# Patient Record
Sex: Female | Born: 1965 | Race: White | Hispanic: No | Marital: Married | Smoking: Former smoker
Health system: Southern US, Community
[De-identification: ages and names within clinical notes are randomized; demographics above are authoritative.]

## PROBLEM LIST (undated history)

## (undated) DIAGNOSIS — Z87442 Personal history of urinary calculi: Secondary | ICD-10-CM

## (undated) DIAGNOSIS — Z8719 Personal history of other diseases of the digestive system: Secondary | ICD-10-CM

## (undated) DIAGNOSIS — K219 Gastro-esophageal reflux disease without esophagitis: Secondary | ICD-10-CM

## (undated) DIAGNOSIS — R0600 Dyspnea, unspecified: Secondary | ICD-10-CM

## (undated) DIAGNOSIS — R05 Cough: Secondary | ICD-10-CM

## (undated) DIAGNOSIS — F329 Major depressive disorder, single episode, unspecified: Secondary | ICD-10-CM

## (undated) DIAGNOSIS — M1990 Unspecified osteoarthritis, unspecified site: Secondary | ICD-10-CM

## (undated) DIAGNOSIS — F32A Depression, unspecified: Secondary | ICD-10-CM

## (undated) DIAGNOSIS — F419 Anxiety disorder, unspecified: Secondary | ICD-10-CM

## (undated) DIAGNOSIS — R059 Cough, unspecified: Secondary | ICD-10-CM

## (undated) DIAGNOSIS — E119 Type 2 diabetes mellitus without complications: Secondary | ICD-10-CM

## (undated) DIAGNOSIS — I10 Essential (primary) hypertension: Secondary | ICD-10-CM

## (undated) HISTORY — DX: Type 2 diabetes mellitus without complications: E11.9

## (undated) HISTORY — PX: TONSILLECTOMY: SUR1361

## (undated) HISTORY — PX: LITHOTRIPSY: SUR834

## (undated) HISTORY — PX: ABDOMINAL HYSTERECTOMY: SHX81

---
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
## 2002-03-10 HISTORY — PX: LAPAROSCOPIC ASSISTED VAGINAL HYSTERECTOMY: SHX5398

## 2007-08-19 ENCOUNTER — Ambulatory Visit: Payer: Self-pay | Admitting: Orthopedic Surgery

## 2007-09-02 ENCOUNTER — Ambulatory Visit: Payer: Self-pay | Admitting: Orthopedic Surgery

## 2009-10-02 ENCOUNTER — Ambulatory Visit: Payer: Self-pay | Admitting: General Practice

## 2013-12-28 ENCOUNTER — Emergency Department: Payer: Self-pay | Admitting: Emergency Medicine

## 2013-12-28 LAB — BASIC METABOLIC PANEL
Anion Gap: 9 (ref 7–16)
BUN: 16 mg/dL (ref 7–18)
CHLORIDE: 105 mmol/L (ref 98–107)
Calcium, Total: 9.1 mg/dL (ref 8.5–10.1)
Co2: 25 mmol/L (ref 21–32)
Creatinine: 0.77 mg/dL (ref 0.60–1.30)
EGFR (African American): >60
Glucose: 159 mg/dL — ABNORMAL HIGH (ref 65–99)
OSMOLALITY: 282 (ref 275–301)
POTASSIUM: 3.7 mmol/L (ref 3.5–5.1)
Sodium: 139 mmol/L (ref 136–145)

## 2013-12-28 LAB — CBC
HCT: 41.2 % (ref 35.0–47.0)
HGB: 13.2 g/dL (ref 12.0–16.0)
MCH: 27 pg (ref 26.0–34.0)
MCHC: 32.1 g/dL (ref 32.0–36.0)
MCV: 84 fL (ref 80–100)
PLATELETS: 270 10*3/uL (ref 150–440)
RBC: 4.89 10*6/uL (ref 3.80–5.20)
RDW: 14.2 % (ref 11.5–14.5)
WBC: 15.8 10*3/uL — ABNORMAL HIGH (ref 3.6–11.0)

## 2013-12-29 LAB — URINALYSIS, COMPLETE
BILIRUBIN, UR: NEGATIVE
Glucose,UR: NEGATIVE mg/dL (ref 0–75)
Ketone: NEGATIVE
LEUKOCYTE ESTERASE: NEGATIVE
Nitrite: NEGATIVE
PROTEIN: NEGATIVE
Ph: 5 (ref 4.5–8.0)
RBC,UR: 159 /HPF (ref 0–5)
SPECIFIC GRAVITY: 1.016 (ref 1.003–1.030)
Squamous Epithelial: 2

## 2014-01-03 ENCOUNTER — Ambulatory Visit: Payer: Self-pay | Admitting: Urology

## 2014-02-09 ENCOUNTER — Ambulatory Visit: Payer: Self-pay | Admitting: Urology

## 2014-03-05 ENCOUNTER — Emergency Department: Payer: Self-pay | Admitting: Emergency Medicine

## 2014-07-01 NOTE — Op Note (Signed)
PATIENT NAME:  Kristi Mccoy, Kristi Mccoy MR#:  [id number] DATE OF BIRTH:  1965-09-23  DATE OF PROCEDURE:  01/03/2014  PREOPERATIVE DIAGNOSIS: Right ureterolithiasis.   POSTOPERATIVE DIAGNOSIS: Passed urinary stone.   PROCEDURES:  1. Ureteroscopy.  2. Fluoroscopy.   SURGEON: Dr. Evelene CroonWolff.  ANESTHETIST: Dr. Darleene CleaverVan Staveren.  ANESTHETIC METHOD: General per Darleene CleaverVan Staveren and local per Dr. Evelene CroonWolff.   INDICATIONS: See the dictated history and physical. After informed consent, the patient requests the above procedure.   OPERATIVE SUMMARY: After adequate general anesthesia had been obtained, the patient was placed into dorsal lithotomy position and the perineum was prepped and draped in the usual fashion. Fluoroscopy was performed. The patient appeared to have a 4-mm calcification present in the right pelvic area. She also had several renal calculi bilaterally.   At this point, the mini Storz ureteroscope was coupled with a camera and then advanced into the bladder. The bladder was thoroughly inspected. No bladder tumors were identified. The ureteroscope was then passed into the right ureter distal ureter and advanced upward. The ureteroscope was advanced all the way to the renal pelvis and a stone or tumors were not identified. These ureteroscope was then slowly backed out of the ureter and again no stones were identified. At this point scope was removed from the bladder, 10 mL of viscous Xylocaine was instilled within the urethra and the bladder. B and O suppository was placed. The procedure was then terminated and the patient was transferred to the recovery room in stable condition.    ____________________________ Suszanne ConnersMichael R. Evelene CroonWolff, MD mrw:lt D: 01/03/2014 13:02:00 ET T: 01/03/2014 22:29:33 ET JOB#: [id number]  cc: Suszanne ConnersMichael R. Evelene CroonWolff, MD, <Dictator> Orson ApeMICHAEL R WOLFF MD ELECTRONICALLY SIGNED 01/04/2014 8:39

## 2014-07-01 NOTE — H&P (Signed)
PATIENT NAME:  Kristi Mccoy, [name] MR#:  [id number] DATE OF BIRTH:  16-Sep-1965  DATE OF ADMISSION:  01/03/2014  CHIEF COMPLAINT: Kidney stone.    HISTORY OF PRESENT ILLNESS:  Kristi Mccoy is a [age] year old Caucasian female who developed right flank pain and urinary urgency prompting Emergency Room visit on October 21. CAT scan at that time revealed a 4 mm distal right ureteral stone. She has failed to pass the stone and comes in now for right ureteroscopic ureterolithotomy with holmium laser lithotripsy.   ALLERGIES:  THE PATIENT WAS ALLERGIC TO ZITHROMAX.     CURRENT MEDICATIONS: Clonazepam, doxycycline, Percocet, Nucynta, and Zofran.   PREVIOUS SURGICAL PROCEDURES:  1.  Cesarean sections x 5.  2.  Hysterectomy in 2004.  3.  Lithotripsy in 1996.   SOCIAL HISTORY: The patient denied tobacco or alcohol use.   FAMILY HISTORY: Remarkable for father with diabetes and mother with hypothyroidism.      PAST AND CURRENT MEDICAL CONDITIONS:  1.  Recurrent kidney stone disease.  2.  Anxiety.  3.  Chronic abdominal pain related to her C-sections.     REVIEW OF SYSTEMS: The patient has constipation, fatigue, occasional diarrhea and a rash that is being treated with doxycycline. She denied chest pain, shortness of breath, diabetes, stroke, or hypertension.   PHYSICAL EXAMINATION:  GENERAL: Obese white female in no distress.  HEENT: Sclerae were clear. Pupils were equally round and reactive to light and accommodation. Extraocular movements are intact.  NECK: Supple. No palpable cervical adenopathy. No audible carotid bruits.  LUNGS: Clear to auscultation.  CARDIOVASCULAR: Regular rhythm and rate without audible murmurs.  ABDOMEN: Soft, nontender abdomen.  GENITOURINARY AND RECTAL: Deferred.  NEUROMUSCULAR: Alert and oriented x 3.   IMPRESSION:  1.  Right ureterolithiasis.  2.  Right nephrolithiasis.   PLAN: Right ureteroscopic ureterolithotomy with holmium laser lithotripsy.     ____________________________ Suszanne ConnersMichael R. Evelene CroonWolff, MD mrw:bu D: 12/29/2013 12:28:00 ET T: 12/29/2013 13:09:39 ET JOB#: [id number]  cc: Suszanne ConnersMichael R. Evelene CroonWolff, MD, <Dictator> Orson ApeMICHAEL R WOLFF MD ELECTRONICALLY SIGNED 01/03/2014 15:03

## 2016-01-08 ENCOUNTER — Encounter: Payer: Self-pay | Admitting: Emergency Medicine

## 2016-01-08 ENCOUNTER — Emergency Department
Admission: EM | Admit: 2016-01-08 | Discharge: 2016-01-09 | Disposition: A | Discharge status: Home / Self Care | Payer: BLUE CROSS/BLUE SHIELD | Attending: Student in an Organized Health Care Education/Training Program | Admitting: Student in an Organized Health Care Education/Training Program

## 2016-01-08 DIAGNOSIS — S0081XA Abrasion of other part of head, initial encounter: Secondary | ICD-10-CM | POA: Insufficient documentation

## 2016-01-08 DIAGNOSIS — S060X9A Concussion with loss of consciousness of unspecified duration, initial encounter: Secondary | ICD-10-CM

## 2016-01-08 DIAGNOSIS — W108XXA Fall (on) (from) other stairs and steps, initial encounter: Secondary | ICD-10-CM | POA: Diagnosis not present

## 2016-01-08 DIAGNOSIS — S0031XA Abrasion of nose, initial encounter: Secondary | ICD-10-CM | POA: Insufficient documentation

## 2016-01-08 DIAGNOSIS — Z23 Encounter for immunization: Secondary | ICD-10-CM | POA: Diagnosis not present

## 2016-01-08 DIAGNOSIS — S0993XA Unspecified injury of face, initial encounter: Secondary | ICD-10-CM

## 2016-01-08 DIAGNOSIS — Y999 Unspecified external cause status: Secondary | ICD-10-CM | POA: Diagnosis not present

## 2016-01-08 DIAGNOSIS — R55 Syncope and collapse: Secondary | ICD-10-CM

## 2016-01-08 DIAGNOSIS — Y9301 Activity, walking, marching and hiking: Secondary | ICD-10-CM | POA: Diagnosis not present

## 2016-01-08 DIAGNOSIS — Y9289 Other specified places as the place of occurrence of the external cause: Secondary | ICD-10-CM | POA: Diagnosis not present

## 2016-01-08 DIAGNOSIS — S0990XA Unspecified injury of head, initial encounter: Secondary | ICD-10-CM | POA: Diagnosis present

## 2016-01-08 NOTE — ED Triage Notes (Signed)
[facility] had sync[facility] episode, fell, and hit head.  Abrasion noted to left forehead and laceration to nose.  Bleeding controlled at this time.  Pt states she may be dehydrated.

## 2016-01-09 ENCOUNTER — Emergency Department: Payer: BLUE CROSS/BLUE SHIELD

## 2016-01-09 LAB — URINALYSIS COMPLETE WITH MICROSCOPIC (ARMC ONLY)
BILIRUBIN URINE: NEGATIVE
Hgb urine dipstick: NEGATIVE
Ketones, ur: NEGATIVE mg/dL
Leukocytes, UA: NEGATIVE
NITRITE: NEGATIVE
Protein, ur: 30 mg/dL — AB
Specific Gravity, Urine: 1.018 (ref 1.005–1.030)
pH: 5 (ref 5.0–8.0)

## 2016-01-09 LAB — CBC
HCT: 43.5 % (ref 35.0–47.0)
Hemoglobin: 14.3 g/dL (ref 12.0–16.0)
MCH: 27.8 pg (ref 26.0–34.0)
MCHC: 32.7 g/dL (ref 32.0–36.0)
MCV: 84.9 fL (ref 80.0–100.0)
PLATELETS: 254 10*3/uL (ref 150–440)
RBC: 5.13 MIL/uL (ref 3.80–5.20)
RDW: 14.1 % (ref 11.5–14.5)
WBC: 13.8 10*3/uL — ABNORMAL HIGH (ref 3.6–11.0)

## 2016-01-09 LAB — BASIC METABOLIC PANEL
Anion gap: 7 (ref 5–15)
BUN: 15 mg/dL (ref 6–20)
CALCIUM: 9.9 mg/dL (ref 8.9–10.3)
CO2: 28 mmol/L (ref 22–32)
CREATININE: 0.96 mg/dL (ref 0.44–1.00)
Chloride: 103 mmol/L (ref 101–111)
GLUCOSE: 237 mg/dL — AB (ref 65–99)
Potassium: 3.6 mmol/L (ref 3.5–5.1)
Sodium: 138 mmol/L (ref 135–145)

## 2016-01-09 LAB — TROPONIN I: Troponin I: <0.03 ng/mL (ref <0.03)

## 2016-01-09 MED ORDER — TETANUS-DIPHTH-ACELL PERTUSSIS 5-2.5-18.5 LF-MCG/0.5 IM SUSP
0.5000 mL | Freq: Once | INTRAMUSCULAR | Status: AC
  Administered 2016-01-09: 0.5 mL via INTRAMUSCULAR
  Filled 2016-01-09: qty 0.5

## 2016-01-09 MED ORDER — BUTALBITAL-APAP-CAFFEINE 50-325-40 MG PO TABS
1.0000 | ORAL_TABLET | Freq: Once | ORAL | Status: AC
  Administered 2016-01-09: 1 via ORAL

## 2016-01-09 MED ORDER — BUTALBITAL-APAP-CAFF-COD 50-325-40-30 MG PO CAPS
1.0000 | ORAL_CAPSULE | ORAL | 0 refills | Status: DC | PRN
Start: 2016-01-09 — End: 2020-05-03

## 2016-01-09 MED ORDER — PROCHLORPERAZINE EDISYLATE 5 MG/ML IJ SOLN
10.0000 mg | Freq: Once | INTRAMUSCULAR | Status: AC
  Administered 2016-01-09: 10 mg via INTRAVENOUS
  Filled 2016-01-09: qty 2

## 2016-01-09 MED ORDER — GI COCKTAIL ~~LOC~~
30.0000 mL | Freq: Once | ORAL | Status: AC
  Administered 2016-01-09: 30 mL via ORAL
  Filled 2016-01-09: qty 30

## 2016-01-09 MED ORDER — PROMETHAZINE HCL 25 MG PO TABS
25.0000 mg | ORAL_TABLET | Freq: Once | ORAL | Status: AC
  Administered 2016-01-09: 25 mg via ORAL

## 2016-01-09 MED ORDER — SODIUM CHLORIDE 0.9 % IV BOLUS (SEPSIS)
1000.0000 mL | Freq: Once | INTRAVENOUS | Status: AC
  Administered 2016-01-09: 1000 mL via INTRAVENOUS

## 2016-01-09 MED ORDER — ACETAMINOPHEN 500 MG PO TABS
1000.0000 mg | ORAL_TABLET | Freq: Once | ORAL | Status: AC
  Administered 2016-01-09: 1000 mg via ORAL
  Filled 2016-01-09: qty 2

## 2016-01-09 MED ORDER — PROMETHAZINE HCL 12.5 MG PO TABS: 12.5000 mg | ORAL_TABLET | Freq: Four times a day (QID) | ORAL | 0 refills | Status: DC | PRN

## 2016-01-09 MED ORDER — BUTALBITAL-APAP-CAFFEINE 50-325-40 MG PO TABS
ORAL_TABLET | ORAL | Status: AC
  Administered 2016-01-09: 1 via ORAL
  Filled 2016-01-09: qty 1

## 2016-01-09 MED ORDER — PROMETHAZINE HCL 25 MG PO TABS
ORAL_TABLET | ORAL | Status: AC
  Administered 2016-01-09: 25 mg via ORAL
  Filled 2016-01-09: qty 1

## 2016-01-09 NOTE — ED Notes (Signed)
Discharge instructions reviewed with patient. Patient verbalized understanding. Patient ambulated to lobby without difficulty.   

## 2016-01-09 NOTE — ED Provider Notes (Signed)
[facility] Emergency Department Provider Note    First MD Initiated Contact with Patient 01/09/16 0000     (approximate)  I have reviewed the triage vital signs and the nursing notes.   HISTORY  Chief Complaint Fall and Loss of Consciousness    HPI Kristi Mccoy is a 50 y.o. female with a history of fainting spells presents with a syncopal episode today. Patient states that she is feeling epigastric discomfort and nausea preceding the syncopal episode. She was walking on her patio. After the nausea started feeling lightheaded and that she was about to pass out. Went to sit down and in trying to lay down passed out and fell down 2 steps and landed on a concrete floor hitting the left side of her face. She woke up laying on the sidewalk. This occurred mid afternoon. She was accompanied by her daughter to the ER who encouraged her to come in. The patient was initially refusing to be evaluated. States that she has been dehydrated with decreased oral intake and decreased appetite over the past week due to increasing stresses. She denies any chest pain or shortness of breath. No previous heart history. She status post cholecystectomy as well as hysterectomy. States that she was imminently have epigastric pain and feels that she has an ulcer. States the epigastric pain occurs associated with a sense of hunger and is improved after eating.   History reviewed. No pertinent past medical history.  There are no active problems to display for this patient.   History reviewed. No pertinent surgical history.  Prior to Admission medications   Medication Sig Start Date End Date Taking? Authorizing Provider  butalbital-acetaminophen-caffeine (FIORICET WITH CODEINE) 50-325-40-30 MG capsule Take 1 capsule by mouth every 4 (four) hours as needed for headache. 01/09/16   Willy EddyPatrick Japji Kok, MD  promethazine (PHENERGAN) 12.5 MG tablet Take 1 tablet (12.5 mg total) by mouth every 6 (six)  hours as needed for nausea or vomiting. 01/09/16   Willy EddyPatrick Jaeleah Smyser, MD    Allergies Azithromycin  History reviewed. No pertinent family history.  Social History Social History  Substance Use Topics  . Smoking status: Never Smoker  . Smokeless tobacco: Never Used  . Alcohol use No    Review of Systems Patient denies headaches, rhinorrhea, blurry vision, numbness, shortness of breath, chest pain, edema, cough, abdominal pain, nausea, vomiting, diarrhea, dysuria, fevers, rashes or hallucinations unless otherwise stated above in HPI. ____________________________________________   PHYSICAL EXAM:  VITAL SIGNS: Vitals:   01/09/16 0500 01/09/16 0533  BP: 122/88 140/85  Pulse: 72 82  Resp: (!) 26 16  Temp:      Constitutional: Alert and oriented.  in no acute distress. Eyes: Conjunctivae are normal. PERRL. EOMI. Head: superficial abrasions to left forehead and bridge of nose.  Midface stable. Nose: No congestion/rhinnorhea. No septal hematoma Mouth/Throat: Mucous membranes are moist.  Oropharynx non-erythematous. Neck: No stridor. Painless ROM. No cervical spine tenderness to palpation Hematological/Lymphatic/Immunilogical: No cervical lymphadenopathy. Cardiovascular: Normal rate, regular rhythm. Grossly normal heart sounds.  Good peripheral circulation. Respiratory: Normal respiratory effort.  No retractions. Lungs CTAB. Gastrointestinal: Soft and nontender. No distention. No abdominal bruits. No CVA tenderness. Musculoskeletal: No lower extremity tenderness nor edema.  No joint effusions. Neurologic:  CN- intact.  No facial droop, Normal FNF.  Normal heel to shin.  Sensation intact bilaterally. Normal speech and language. No gross focal neurologic deficits are appreciated. No gait instability. Skin:  Skin is warm, dry and intact. Abrasion as above Psychiatric:  Mood and affect are normal. Speech and behavior are normal.  ____________________________________________    LABS (all labs ordered are listed, but only abnormal results are displayed)  Results for orders placed or performed during the hospital encounter of 01/08/16 (from the past 24 hour(s))  Basic metabolic panel     Status: Abnormal   Collection Time: 01/09/16 12:10 AM  Result Value Ref Range   Sodium 138 135 - 145 mmol/L   Potassium 3.6 3.5 - 5.1 mmol/L   Chloride 103 101 - 111 mmol/L   CO2 28 22 - 32 mmol/L   Glucose, Bld 237 (H) 65 - 99 mg/dL   BUN 15 6 - 20 mg/dL   Creatinine, Ser 1.61 0.44 - 1.00 mg/dL   Calcium 9.9 8.9 - 09.6 mg/dL   GFR calc non Af Amer >60 >60 mL/min   GFR calc Af Amer >60 >60 mL/min   Anion gap 7 5 - 15  CBC     Status: Abnormal   Collection Time: 01/09/16 12:10 AM  Result Value Ref Range   WBC 13.8 (H) 3.6 - 11.0 K/uL   RBC 5.13 3.80 - 5.20 MIL/uL   Hemoglobin 14.3 12.0 - 16.0 g/dL   HCT 04.5 40.9 - 81.1 %   MCV 84.9 80.0 - 100.0 fL   MCH 27.8 26.0 - 34.0 pg   MCHC 32.7 32.0 - 36.0 g/dL   RDW 91.4 78.2 - 95.6 %   Platelets 254 150 - 440 K/uL  Troponin I     Status: None   Collection Time: 01/09/16 12:10 AM  Result Value Ref Range   Troponin I <0.03 <0.03 ng/mL  Troponin I     Status: None   Collection Time: 01/09/16  2:51 AM  Result Value Ref Range   Troponin I <0.03 <0.03 ng/mL  Urinalysis complete, with microscopic     Status: Abnormal   Collection Time: 01/09/16  4:26 AM  Result Value Ref Range   Color, Urine YELLOW (A) YELLOW   APPearance HAZY (A) CLEAR   Glucose, UA >500 (A) NEGATIVE mg/dL   Bilirubin Urine NEGATIVE NEGATIVE   Ketones, ur NEGATIVE NEGATIVE mg/dL   Specific Gravity, Urine 1.018 1.005 - 1.030   Hgb urine dipstick NEGATIVE NEGATIVE   pH 5.0 5.0 - 8.0   Protein, ur 30 (A) NEGATIVE mg/dL   Nitrite NEGATIVE NEGATIVE   Leukocytes, UA NEGATIVE NEGATIVE   RBC / HPF 0-5 0 - 5 RBC/hpf   WBC, UA 0-5 0 - 5 WBC/hpf   Bacteria, UA RARE (A) NONE SEEN   Squamous Epithelial / LPF 0-5 (A) NONE SEEN   Mucous PRESENT    Hyaline Casts,  UA PRESENT    ____________________________________________  EKG My review and personal interpretation at Time: 0:04   Indication: syncope  Rate: 65  Rhythm: sinus Axis: normal Other: no acute ischemia, normal intervals, no wpw, brugada ____________________________________________  RADIOLOGY  I personally reviewed all radiographic images ordered to evaluate for the above acute complaints and reviewed radiology reports and findings.  These findings were personally discussed with the patient.  Please see medical record for radiology report.  ____________________________________________   PROCEDURES  Procedure(s) performed: none    Critical Care performed: no ____________________________________________   INITIAL IMPRESSION / ASSESSMENT AND PLAN / ED COURSE  Pertinent labs & imaging results that were available during my care of the patient were reviewed by me and considered in my medical decision making (see chart for details).  DDX: sah, iph, sdh, vasovagal,  dysrhythmia, acs, dehydration, uti  Kristi Mccoy is a 50 y.o. who presents to the ED with a syncopal episode and facial and head trauma as described above. Patient afebrile and hemodynamic stable. No evidence of thoracic or abdominal trauma on exam. No extremity trauma. CT imaging of the head ordered due to concern for acute traumatic injury. There is no evidence of proptosis or surrounding periorbital swelling. No clinical evidence of extraocular muscle entrapment. No trauma to the globe. CT imaging shows no evidence of acute intracranial abnormality.  Chest x-ray without any significant abnormality and no evidence of cardiomegaly or pneumothorax. Laboratory evaluation is unremarkable. EKG shows no evidence of dysrhythmia. Patient with multiple episodes of what she claims were vasovagal syncopal episodes in the past. Also has similar episodes. We'll provide wound care as well as observe patient on telemetry with repeat troponin to  further risk stratify for ACS. Patient denies any chest pain or shortness of breath.  Not clinically consistent with ACS.  The patient will be placed on continuous pulse oximetry and telemetry for monitoring.  Laboratory evaluation will be sent to evaluate for the above complaints.     Clinical Course  Comment By Time  Patient was able to tolerate PO and was able to ambulate with a steady gait.  Willy EddyPatrick Atiba Kimberlin, MD 11/01 0430  UA without evidence of infection.  Wound care was provided. Patient with improvement in symptoms. I discussed signs and symptoms of concussion and appropriate conservative therapy. Discussed signs and symptoms for which the patient should return immediately to the ER. Discussed follow-up with PCP.  Have discussed with the patient and available family all diagnostics and treatments performed thus far and all questions were answered to the best of my ability. The patient demonstrates understanding and agreement with plan.   Willy EddyPatrick Magdalyn Arenivas, MD 11/01 [phone]     ____________________________________________   FINAL CLINICAL IMPRESSION(S) / ED DIAGNOSES  Final diagnoses:  Syncope and collapse  Facial injury, initial encounter  Concussion with loss of consciousness, initial encounter      NEW MEDICATIONS STARTED DURING THIS VISIT:  Discharge Medication List as of 01/09/2016  5:46 AM    START taking these medications   Details  butalbital-acetaminophen-caffeine (FIORICET WITH CODEINE) 50-325-40-30 MG capsule Take 1 capsule by mouth every 4 (four) hours as needed for headache., Starting Wed 01/09/2016, Print    promethazine (PHENERGAN) 12.5 MG tablet Take 1 tablet (12.5 mg total) by mouth every 6 (six) hours as needed for nausea or vomiting., Starting Wed 01/09/2016, Print         Note:  This document was prepared using Dragon voice recognition software and may include unintentional dictation errors.    Willy EddyPatrick Quade Ramirez, MD 01/09/16 [phone]

## 2016-01-09 NOTE — ED Notes (Signed)
Pt ambulated in hall without difficulty. Pt denied dizziness, blurred vision, chest pain or SOB.

## 2016-01-09 NOTE — Discharge Instructions (Signed)
Keep abrasions out of sunlight until completely healed to prevent scarring.  Use cool compresses and compression for swelling.  Return for worsening symptoms, concerns, questions.

## 2016-01-09 NOTE — ED Notes (Signed)
ED Provider at bedside. 

## 2016-02-05 ENCOUNTER — Ambulatory Visit: Payer: BLUE CROSS/BLUE SHIELD | Admitting: *Deleted

## 2016-02-11 ENCOUNTER — Ambulatory Visit: Payer: BLUE CROSS/BLUE SHIELD | Admitting: *Deleted

## 2018-03-09 ENCOUNTER — Other Ambulatory Visit: Payer: Self-pay

## 2018-03-09 ENCOUNTER — Emergency Department
Admission: EM | Admit: 2018-03-09 | Discharge: 2018-03-09 | Disposition: A | Discharge status: Home / Self Care | Payer: BLUE CROSS/BLUE SHIELD | Attending: Emergency Medicine | Admitting: Emergency Medicine

## 2018-03-09 ENCOUNTER — Emergency Department: Payer: BLUE CROSS/BLUE SHIELD

## 2018-03-09 ENCOUNTER — Encounter: Payer: Self-pay | Admitting: Emergency Medicine

## 2018-03-09 DIAGNOSIS — Z7982 Long term (current) use of aspirin: Secondary | ICD-10-CM | POA: Diagnosis not present

## 2018-03-09 DIAGNOSIS — R0789 Other chest pain: Secondary | ICD-10-CM | POA: Insufficient documentation

## 2018-03-09 HISTORY — DX: Depression, unspecified: F32.A

## 2018-03-09 HISTORY — DX: Anxiety disorder, unspecified: F41.9

## 2018-03-09 HISTORY — DX: Major depressive disorder, single episode, unspecified: F32.9

## 2018-03-09 LAB — CBC
HCT: 45.4 % (ref 36.0–46.0)
HEMOGLOBIN: 14.8 g/dL (ref 12.0–15.0)
MCH: 27.7 pg (ref 26.0–34.0)
MCHC: 32.6 g/dL (ref 30.0–36.0)
MCV: 84.9 fL (ref 80.0–100.0)
Platelets: 275 10*3/uL (ref 150–400)
RBC: 5.35 MIL/uL — AB (ref 3.87–5.11)
RDW: 13 % (ref 11.5–15.5)
WBC: 7.4 10*3/uL (ref 4.0–10.5)
nRBC: 0 % (ref 0.0–0.2)

## 2018-03-09 LAB — BASIC METABOLIC PANEL
ANION GAP: 10 (ref 5–15)
BUN: 12 mg/dL (ref 6–20)
CALCIUM: 10.2 mg/dL (ref 8.9–10.3)
CO2: 22 mmol/L (ref 22–32)
Chloride: 105 mmol/L (ref 98–111)
Creatinine, Ser: 0.7 mg/dL (ref 0.44–1.00)
GFR calc Af Amer: >60 mL/min (ref >60)
GFR calc non Af Amer: >60 mL/min (ref >60)
GLUCOSE: 195 mg/dL — AB (ref 70–99)
POTASSIUM: 3.9 mmol/L (ref 3.5–5.1)
Sodium: 137 mmol/L (ref 135–145)

## 2018-03-09 LAB — TROPONIN I
Troponin I: <0.03 ng/mL (ref <0.03)
Troponin I: <0.03 ng/mL (ref <0.03)

## 2018-03-09 NOTE — ED Provider Notes (Signed)
[facility] Emergency Department Provider Note ____________________________________________   First MD Initiated Contact with Patient 03/09/18 1347     (approximate)  I have reviewed the triage vital signs and the nursing notes.   HISTORY  Chief Complaint Chest Pain    HPI Kristi Mccoy is a 52 y.o. female with PMH as noted below who presents with left-sided chest pain, gradual onset over the last few days, described as a tightness or pressure, and associated with nausea and decreased appetite.  She states that sometimes radiates to her left breast or left shoulder.  She states it is nonexertional and non-positional.  She has no significant shortness of breath or lightheadedness.  The patient reports significantly increased anxiety recently with some life stressors related to her family.  She states that she has had decreased appetite for this reason.  She also reports depression but this is chronic.  She denies SI or HI.   Past Medical History:  Diagnosis Date  . Anxiety   . Depression     There are no active problems to display for this patient.   Past Surgical History:  Procedure Laterality Date  . ABDOMINAL HYSTERECTOMY     partial  . CESAREAN SECTION     x5    Prior to Admission medications   Medication Sig Start Date End Date Taking? Authorizing Provider  butalbital-acetaminophen-caffeine (FIORICET WITH CODEINE) 50-325-40-30 MG capsule Take 1 capsule by mouth every 4 (four) hours as needed for headache. 01/09/16   Willy Eddyobinson, Patrick, MD  promethazine (PHENERGAN) 12.5 MG tablet Take 1 tablet (12.5 mg total) by mouth every 6 (six) hours as needed for nausea or vomiting. 01/09/16   Willy Eddyobinson, Patrick, MD    Allergies Azithromycin  No family history on file.  Social History Social History   Tobacco Use  . Smoking status: Never Smoker  . Smokeless tobacco: Never Used  Substance Use Topics  . Alcohol use: No  . Drug use: Never    Review of  Systems  Constitutional: No fever. Eyes: No redness. ENT: No neck pain. Cardiovascular: Positive for chest pain. Respiratory: Denies shortness of breath. Gastrointestinal: Positive for nausea. Genitourinary: Negative for flank pain.  Musculoskeletal: Negative for back pain. Skin: Negative for rash. Neurological: Negative for headache.   ____________________________________________   PHYSICAL EXAM:  VITAL SIGNS: ED Triage Vitals [03/09/18 1057]  Enc Vitals Group     BP (!) 158/95     Pulse Rate 77     Resp 16     Temp 98.3 F (36.8 C)     Temp Source Oral     SpO2 96 %     Weight 220 lb (99.8 kg)     Height 5\' 3"  (1.6 m)     Head Circumference      Peak Flow      Pain Score 6     Pain Loc      Pain Edu?      Excl. in GC?     Constitutional: Alert and oriented. Well appearing and in no acute distress. Eyes: Conjunctivae are normal.  Head: Atraumatic. Nose: No congestion/rhinnorhea. Mouth/Throat: Mucous membranes are moist.   Neck: Normal range of motion.  Cardiovascular: Normal rate, regular rhythm. Grossly normal heart sounds.  Good peripheral circulation. Respiratory: Normal respiratory effort.  No retractions. Lungs CTAB. Gastrointestinal: No distention.  Musculoskeletal: No lower extremity edema.  Extremities warm and well perfused.  Neurologic:  Normal speech and language. No gross focal neurologic deficits are appreciated.  Skin:  Skin is warm and dry. No rash noted.  No lymphadenopathy in the left axilla or chest wall or left lateral breast.  Breast is nontender. Psychiatric: Anxious appearing.  ____________________________________________   LABS (all labs ordered are listed, but only abnormal results are displayed)  Labs Reviewed  BASIC METABOLIC PANEL - Abnormal; Notable for the following components:      Result Value   Glucose, Bld 195 (*)    All other components within normal limits  CBC - Abnormal; Notable for the following components:   RBC  5.35 (*)    All other components within normal limits  TROPONIN I  TROPONIN I   ____________________________________________  EKG  ED ECG REPORT I, Dionne BucySebastian Dasha Kawabata, the attending physician, personally viewed and interpreted this ECG.  Date: 03/09/2018 EKG Time: 1050 Rate: 80 rhythm: normal sinus rhythm QRS Axis: normal Intervals: normal ST/T Wave abnormalities: normal Narrative Interpretation: no evidence of acute ischemia  ____________________________________________  RADIOLOGY  CXR: No focal infiltrate or other acute abnormality  ____________________________________________   PROCEDURES  Procedure(s) performed: No  Procedures  Critical Care performed: No ____________________________________________   INITIAL IMPRESSION / ASSESSMENT AND PLAN / ED COURSE  Pertinent labs & imaging results that were available during my care of the patient were reviewed by me and considered in my medical decision making (see chart for details).  52 year old female with PMH as noted above presents with atypical left-sided chest pain over the last several days associated with increased stress.  The patient reports nausea and decreased appetite.  The pain sometimes radiates to her left breast but she has no swelling, lymphadenopathy, or other acute symptoms related to the breast.  On exam she is anxious but relatively well-appearing.  Her vital signs are normal except for hypertension.  The remainder of her exam is unremarkable.  EKG is nonischemic.  Overall the presentation is not consistent with ACS.  There is no clinical evidence for DVT or PE, and no evidence for aortic dissection or other vascular etiology given the patient's vital signs and well appearance.  Overall I suspect most likely musculoskeletal pain, GERD especially related to her decreased p.o. intake, or primarily anxiety.  I had an extensive discussion with the patient about the likely causes of her pain and about  managing her life stressors.  Although the patient reports depression, this is chronic for her and she has no SI or HI.  I also encouraged her to eat small amounts more frequently and I will instruct her to start on an antacid for likely GERD.  Initial lab work-up, x-ray and troponin are all negative.  We will obtain a second troponin.  I anticipate discharge home if it is negative.  ----------------------------------------- 3:43 PM on 03/09/2018 -----------------------------------------  Repeat troponin is negative.  The patient continues to be comfortable.  She is still somewhat hypertensive although this is consistent with her anxiety.  I counseled her on the results of the work-up.  She is stable for discharge home at this time.  Return precautions given, and she expressed understanding. ____________________________________________   FINAL CLINICAL IMPRESSION(S) / ED DIAGNOSES  Final diagnoses:  Atypical chest pain      NEW MEDICATIONS STARTED DURING THIS VISIT:  Discharge Medication List as of 03/09/2018  3:14 PM       Note:  This document was prepared using Dragon voice recognition software and may include unintentional dictation errors.   Dionne BucySiadecki, Obinna Ehresman, MD 03/09/18 1544

## 2018-03-09 NOTE — Discharge Instructions (Addendum)
You should start taking an acid reducing medication such as Pepcid (famotidine) twice daily for the next few weeks to help decrease stomach acid.  Eat small meals or snacks throughout the day and make sure to drink small sips of water frequently to stay hydrated.  Follow-up with your regular doctor.  Return to the ER for new, worsening, persistent severe chest pain, difficulty breathing, vomiting, weakness or lightheadedness, or any other new or worsening symptoms that concern you.

## 2018-03-09 NOTE — ED Triage Notes (Signed)
Patient reports left-sided chest pain x3 day with radiation to jaw and left arm. Patient reports nausea and decreased appetite. Reports she typically has similar symptoms when she is stressed. Patient tearful in triage. Also reports SOB.

## 2018-03-09 NOTE — ED Notes (Signed)
Pt c/o chest pain radiating into the jaw for the past 3 days with nausea. Pt states she has been under a lot of stress recently and but wants to make sure nothing serious is wrong. Pt is ambulatory to the room with out any difficulty or SOB.

## 2018-09-01 ENCOUNTER — Other Ambulatory Visit: Payer: Self-pay

## 2018-09-01 ENCOUNTER — Encounter
Admission: RE | Admit: 2018-09-01 | Discharge: 2018-09-01 | Disposition: A | Discharge status: Home / Self Care | Payer: BC Managed Care – PPO | Source: Ambulatory Visit | Attending: Orthopedic Surgery | Admitting: Orthopedic Surgery

## 2018-09-01 DIAGNOSIS — Z01812 Encounter for preprocedural laboratory examination: Secondary | ICD-10-CM | POA: Insufficient documentation

## 2018-09-01 HISTORY — DX: Cough, unspecified: R05.9

## 2018-09-01 HISTORY — DX: Personal history of urinary calculi: Z87.442

## 2018-09-01 HISTORY — DX: Dyspnea, unspecified: R06.00

## 2018-09-01 HISTORY — DX: Unspecified osteoarthritis, unspecified site: M19.90

## 2018-09-01 LAB — CBC
HCT: 43.2 % (ref 36.0–46.0)
Hemoglobin: 14.5 g/dL (ref 12.0–15.0)
MCH: 28.2 pg (ref 26.0–34.0)
MCHC: 33.6 g/dL (ref 30.0–36.0)
MCV: 83.9 fL (ref 80.0–100.0)
Platelets: 271 10*3/uL (ref 150–400)
RBC: 5.15 MIL/uL — ABNORMAL HIGH (ref 3.87–5.11)
RDW: 13.1 % (ref 11.5–15.5)
WBC: 8.5 10*3/uL (ref 4.0–10.5)
nRBC: 0 % (ref 0.0–0.2)

## 2018-09-01 LAB — URINALYSIS, ROUTINE W REFLEX MICROSCOPIC
Bilirubin Urine: NEGATIVE
Glucose, UA: 50 mg/dL — AB
Hgb urine dipstick: NEGATIVE
Ketones, ur: NEGATIVE mg/dL
Leukocytes,Ua: NEGATIVE
Nitrite: NEGATIVE
Protein, ur: NEGATIVE mg/dL
Specific Gravity, Urine: 1.024 (ref 1.005–1.030)
pH: 5 (ref 5.0–8.0)

## 2018-09-01 LAB — SEDIMENTATION RATE: Sed Rate: 7 mm/hr (ref 0–30)

## 2018-09-01 LAB — COMPREHENSIVE METABOLIC PANEL
ALT: 31 U/L (ref 0–44)
AST: 26 U/L (ref 15–41)
Albumin: 4.4 g/dL (ref 3.5–5.0)
Alkaline Phosphatase: 110 U/L (ref 38–126)
Anion gap: 11 (ref 5–15)
BUN: 14 mg/dL (ref 6–20)
CO2: 23 mmol/L (ref 22–32)
Calcium: 9.7 mg/dL (ref 8.9–10.3)
Chloride: 101 mmol/L (ref 98–111)
Creatinine, Ser: 0.59 mg/dL (ref 0.44–1.00)
GFR calc Af Amer: >60 mL/min (ref >60)
GFR calc non Af Amer: >60 mL/min (ref >60)
Glucose, Bld: 228 mg/dL — ABNORMAL HIGH (ref 70–99)
Potassium: 3.8 mmol/L (ref 3.5–5.1)
Sodium: 135 mmol/L (ref 135–145)
Total Bilirubin: 0.8 mg/dL (ref 0.3–1.2)
Total Protein: 7.7 g/dL (ref 6.5–8.1)

## 2018-09-01 LAB — SURGICAL PCR SCREEN
MRSA, PCR: NEGATIVE
Staphylococcus aureus: NEGATIVE

## 2018-09-01 LAB — TYPE AND SCREEN
ABO/RH(D): O POS
Antibody Screen: NEGATIVE

## 2018-09-01 LAB — APTT: aPTT: 30 seconds (ref 24–36)

## 2018-09-01 LAB — PROTIME-INR
INR: 0.9 (ref 0.8–1.2)
Prothrombin Time: 12.4 seconds (ref 11.4–15.2)

## 2018-09-01 LAB — C-REACTIVE PROTEIN: CRP: 1 mg/dL — ABNORMAL HIGH (ref <1.0)

## 2018-09-01 NOTE — Pre-Procedure Instructions (Signed)
Carb drink and incentive spirometry given and instructed.

## 2018-09-01 NOTE — Patient Instructions (Signed)
Your procedure is scheduled on: 09/08/2018 Wed Report to Same Day Surgery 2nd floor medical mall [facility] Entrance-take elevator on left to 2nd floor.  Check in with surgery information desk.) To find out your arrival time please call [phone] between 1PM - 3PM on 09/07/2018 Tues  Remember: Instructions that are not followed completely may result in serious medical risk, up to and including death, or upon the discretion of your surgeon and anesthesiologist your surgery may need to be rescheduled.    _x___ 1. Do not eat food after midnight the night before your procedure. You may drink clear liquids up to 2 hours before you are scheduled to arrive at the hospital for your procedure.  Do not drink clear liquids within 2 hours of your scheduled arrival to the hospital.  Clear liquids include  --Water or Apple juice without pulp  --Clear carbohydrate beverage such as ClearFast or Gatorade  --Black Coffee or Clear Tea (No milk, no creamers, do not add anything to                  the coffee or Tea Type 1 and type 2 diabetics should only drink water.   ____Ensure clear carbohydrate drink on the way to the hospital for bariatric patients  ____Ensure clear carbohydrate drink 3 hours before surgery for Dr Dwyane Luo patients if physician instructed.   No gum chewing or hard candies.     __x__ 2. No Alcohol for 24 hours before or after surgery.   __x__3. No Smoking or e-cigarettes for 24 prior to surgery.  Do not use any chewable tobacco products for at least 6 hour prior to surgery   ____  4. Bring all medications with you on the day of surgery if instructed.    __x__ 5. Notify your doctor if there is any change in your medical condition     (cold, fever, infections).    x___6. On the morning of surgery brush your teeth with toothpaste and water.  You may rinse your mouth with mouth wash if you wish.  Do not swallow any toothpaste or mouthwash.   Do not wear jewelry, make-up, hairpins,  clips or nail polish.  Do not wear lotions, powders, or perfumes. You may wear deodorant.  Do not shave 48 hours prior to surgery. Men may shave face and neck.  Do not bring valuables to the hospital.    [facility] is not responsible for any belongings or valuables.               Contacts, dentures or bridgework may not be worn into surgery.  Leave your suitcase in the car. After surgery it may be brought to your room.  For patients admitted to the hospital, discharge time is determined by your                       treatment team.  _  Patients discharged the day of surgery will not be allowed to drive home.  You will need someone to drive you home and stay with you the night of your procedure.    Please read over the following fact sheets that you were given:   [facility] Preparing for Surgery and or MRSA Information   _x___ Take anti-hypertensive listed below, cardiac, seizure, asthma,     anti-reflux and psychiatric medicines. These include:  1. clonazePAM (KLONOPIN) 1 MG tablet  2.sertraline (ZOLOFT) 100 MG tablet  3.  4.  5.  6.  ____Fleets enema or Magnesium Citrate as directed.   _x___ Use CHG Soap or sage wipes as directed on instruction sheet   ____ Use inhalers on the day of surgery and bring to hospital day of surgery  ____ Stop Metformin and Janumet 2 days prior to surgery.    ____ Take 1/2 of usual insulin dose the night before surgery and none on the morning     surgery.   _x___ Follow recommendations from Cardiologist, Pulmonologist or PCP regarding          stopping Aspirin, Coumadin, Plavix ,Eliquis, Effient, or Pradaxa, and Pletal.  X____Stop Anti-inflammatories such as Advil, Aleve, Ibuprofen, Motrin, Naproxen, Naprosyn, Goodies powders or aspirin products. OK to take Tylenol and                          Celebrex.   _x___ Stop supplements until after surgery.  But may continue Vitamin D, Vitamin B,       and multivitamin.   ____ Bring C-Pap to the  hospital.

## 2018-09-02 LAB — URINE CULTURE: Special Requests: NORMAL

## 2018-09-03 ENCOUNTER — Other Ambulatory Visit
Admission: RE | Admit: 2018-09-03 | Discharge: 2018-09-03 | Disposition: A | Discharge status: Home / Self Care | Payer: BC Managed Care – PPO | Source: Ambulatory Visit | Attending: Orthopedic Surgery | Admitting: Orthopedic Surgery

## 2018-09-03 ENCOUNTER — Other Ambulatory Visit: Payer: Self-pay

## 2018-09-03 DIAGNOSIS — Z1159 Encounter for screening for other viral diseases: Secondary | ICD-10-CM | POA: Diagnosis not present

## 2018-09-03 DIAGNOSIS — Z01812 Encounter for preprocedural laboratory examination: Secondary | ICD-10-CM | POA: Diagnosis not present

## 2018-09-04 LAB — HEMOGLOBIN A1C
Hgb A1c MFr Bld: 9.1 % — ABNORMAL HIGH (ref 4.8–5.6)
Mean Plasma Glucose: 214 mg/dL

## 2018-09-04 LAB — NOVEL CORONAVIRUS, NAA (HOSP ORDER, SEND-OUT TO REF LAB; TAT 18-24 HRS): SARS-CoV-2, NAA: NOT DETECTED

## 2018-09-08 ENCOUNTER — Encounter: Admission: RE | Payer: Self-pay | Source: Ambulatory Visit

## 2018-09-08 ENCOUNTER — Inpatient Hospital Stay
Admission: RE | Admit: 2018-09-08 | Payer: BC Managed Care – PPO | Source: Ambulatory Visit | Admitting: Orthopedic Surgery

## 2018-09-08 SURGERY — ARTHROPLASTY, KNEE, TOTAL, USING IMAGELESS COMPUTER-ASSISTED NAVIGATION
Anesthesia: Choice | Laterality: Left

## 2020-04-27 ENCOUNTER — Other Ambulatory Visit: Payer: Self-pay | Admitting: Nurse Practitioner

## 2020-04-27 DIAGNOSIS — Z7189 Other specified counseling: Secondary | ICD-10-CM

## 2020-04-27 NOTE — Progress Notes (Signed)
Patient in need of referral to diabetic nutritionist- referral placed by provider converting to internal referral so patient can see nutritionist at [facility].

## 2020-05-01 ENCOUNTER — Other Ambulatory Visit: Payer: Self-pay

## 2020-05-01 ENCOUNTER — Ambulatory Visit: Payer: Self-pay | Admitting: Dietician

## 2020-05-01 NOTE — Progress Notes (Signed)
Nutrition consult: 05/01/2020  CC: " I need help to get my glucose under control so I can feel better and have knee surgery."  Assessment: Gives hx of having been dx with diabetes 3 years ago.Seems that she has not addressed the issues involved with diabetes self-management.  Needs "surgery to LF knee for the bone-on-bone pain." Surgery cancelled due to current A1C of 10.2% and fasting glucose of 215 mg/dl.  Seeing primary MD for diabetes care.  Currently on Metformin and being switched to Trijardy XR.  To start today.  She is very fearful of needles and an injectable therapy is not possible as far as she is concerned. She has an order to be seen at [facility] for Diabetes Self-Management.    Currently checking blood glucose levels.  At no point has she had a blood glucose level during the day less than 199 mm/dl. Fasting glucose levels are 200-220. Her knee pain limits mobility and activity.  She literally limits the amount of walking that she does.   At one point was eating one meal per day because she did not have to walk as much and stand up to prepare food.  Plus, "I'm just not hungry."  Gives a hx of GDM with 3 of her 5 pregnancies.  Did a carb restricted diet at that time and did well.  "I don't remember how the diet goes, but I want to try to get my sugar down."  Current Nutrient Intake: 8-9:00 AM 2 slices wheat toast + eggs and 1/2 slice cheese or small amount of PB. Mid-day: 2-3 thin slices of swiss cheese with a slice of roast beef. OR, if hungry; a salad of lettuce, grilled chicken, cheese and croutons. Evening: If did not have salad during the day, then will eat it in the evening or Pj Zehner have a chicken salad sandwich on wheat bread.  Constantly thirsty.  Drinks water, diet Pepsi, SF Natures Twist, or uses SF flavor packets for water. Drinks constantly through out the day and at night when awake.  Physical Activity: Very limited at this time due to knees.  Goal is to have LF knee surgery,  recover and then have the RT knee surgery.  Recommendations: 1. Limit carb to 15-30 gm per meal at breakfast, lunch and dinner.  If snacking have 15 gm of carb for snack.   2. Have a lean protein at all meals and snacks. 3. Increase use of non-starchy vegetables in diet.  Consider use of Steamers for cooked vegetable source. 4. Always use whole grain breads/cereals. 5. Use your food label, looking for 3 gm of fiber per serving where appropriate. 6. Monitor serving size of starchy vegetables, pasta and fruits.   7. Limit sugars to 0-9 gm/serving. 8. Use lean meats, serving size at 2-3 oz. 9. Use poultry and fish more often than your red meats. 10. Bake/broil/grill/roast r/t frying meats. 11. Use the monounsaturated fats and polyunsaturated r/t the saturated fats. 12. When resting in bed or chair during the day, take cans of soup or vegetables and use as weights.  Establish a routine of repetitions for use of the major muscles of the arms.  Start with as little as 2 reps per exercise routine.Do exercises each hour during the day. 13. See Inetta Fermo at [facility], and work with her at what would be the best support system for your diabetes self-management.  Currently you are in need of increased support. 14. I will be back at [facility] in March; if  you choose, you might follow-up with me.  Call [facility] for an appointment if you choose.  Teaching Materials: Handout for Pre-diabetes to use for the eating/dietary information. Diet Card with prescription. Food label with nutrition input. Foods and Food Groups handout.  Maggie Jarmaine Ehrler, RN, RD, LDN

## 2020-05-03 ENCOUNTER — Encounter: Payer: BC Managed Care – PPO | Attending: Family Medicine | Admitting: *Deleted

## 2020-05-03 ENCOUNTER — Other Ambulatory Visit: Payer: Self-pay

## 2020-05-03 ENCOUNTER — Encounter: Payer: Self-pay | Admitting: *Deleted

## 2020-05-03 VITALS — BP 136/90 | Ht 63.0 in | Wt 210.2 lb

## 2020-05-03 DIAGNOSIS — E1165 Type 2 diabetes mellitus with hyperglycemia: Secondary | ICD-10-CM

## 2020-05-03 DIAGNOSIS — E119 Type 2 diabetes mellitus without complications: Secondary | ICD-10-CM | POA: Insufficient documentation

## 2020-05-03 NOTE — Progress Notes (Signed)
Diabetes Self-Management Education  Visit Type: First/Initial  Appt. Start Time: 1320 Appt. End Time: 1445  05/03/2020  Kristi Mccoy, identified by name and date of birth, is a 55 y.o. female with a diagnosis of Diabetes: Type 2.   ASSESSMENT  Blood pressure 136/90, height 5\' 3"  (1.6 m), weight 210 lb 3.2 oz (95.3 kg). Body mass index is 37.24 kg/m.   Diabetes Self-Management Education - 05/03/20 1454      Visit Information   Visit Type First/Initial      Initial Visit   Diabetes Type Type 2    Are you currently following a meal plan? Yes    What type of meal plan do you follow? "trying"    Are you taking your medications as prescribed? Yes    Date Diagnosed A1C of 8.4 % in 2017      Health Coping   How would you rate your overall health? Poor      Psychosocial Assessment   Patient Belief/Attitude about Diabetes Defeat/Burnout   "upset, depressed"   Self-care barriers Unsteady gait/risk for falls    Self-management support Doctor's office    Patient Concerns Nutrition/Meal planning;Glycemic Control;Medication;Monitoring;Weight Control;Healthy Lifestyle    Special Needs None    Preferred Learning Style Auditory    Learning Readiness Ready    How often do you need to have someone help you when you read instructions, pamphlets, or other written materials from your doctor or pharmacy? 1 - Never    What is the last grade level you completed in school? college      Pre-Education Assessment   Patient understands the diabetes disease and treatment process. Needs Review    Patient understands incorporating nutritional management into lifestyle. Needs Instruction    Patient undertands incorporating physical activity into lifestyle. Needs Instruction    Patient understands using medications safely. Needs Instruction    Patient understands monitoring blood glucose, interpreting and using results Needs Review    Patient understands prevention, detection, and treatment of acute  complications. Needs Instruction    Patient understands prevention, detection, and treatment of chronic complications. Needs Review    Patient understands how to develop strategies to address psychosocial issues. Needs Instruction    Patient understands how to develop strategies to promote health/change behavior. Needs Instruction      Complications   Last HgB A1C per patient/outside source 10.3 %   04/18/2020   How often do you check your blood sugar? 3-4 times/day   Pt brought her meter but required more education. She was not replacing the lancet device cap and sticking her finger with the lancet sticking out. BG in the office was 137 mg/dL at 06/16/2020 pm - 4 1/2 hrs pp.   Fasting Blood glucose range (mg/dL) 3:41   FBG's 199-226 mg/dL with reading of 937-902;409-735;>329 mg/dL today   Postprandial Blood glucose range (mg/dL) 924   pp of 185-300 mg/dL   Have you had a dilated eye exam in the past 12 months? No    Have you had a dental exam in the past 12 months? No    Are you checking your feet? No      Dietary Intake   Breakfast pt saw RD at [facility] and reports eating same as reported to her on 2/22 - wheat toast, eggs , cheese or small amount of PB.    Lunch swiss cheese with a slice of roast beef. OR, if hungry; a salad of lettuce, grilled chicken, cheese and croutons.  Dinner If did not have salad during the day, then will eat it in the evening or may have a chicken salad sandwich on wheat bread.    Beverage(s) water, diet Pepsi, SF Natures Twist, or uses SF flavor packets for water      Exercise   Exercise Type ADL's      Patient Education   Previous Diabetes Education Yes (please comment)   Education for Gestational diabetes 20-30 years ago; saw RD at [facility] this week   Disease state  Definition of diabetes, type 1 and 2, and the diagnosis of diabetes;Factors that contribute to the development of diabetes    Nutrition management  Role of diet in the treatment of diabetes and the  relationship between the three main macronutrients and blood glucose level;Food label reading, portion sizes and measuring food.;Carbohydrate counting;Reviewed blood glucose goals for pre and post meals and how to evaluate the patients' food intake on their blood glucose level.    Physical activity and exercise  Role of exercise on diabetes management, blood pressure control and cardiac health.    Medications Reviewed patients medication for diabetes, action, purpose, timing of dose and side effects.    Monitoring Taught/evaluated SMBG meter.;Purpose and frequency of SMBG.;Taught/discussed recording of test results and interpretation of SMBG.;Identified appropriate SMBG and/or A1C goals.    Chronic complications Relationship between chronic complications and blood glucose control    Psychosocial adjustment Role of stress on diabetes;Identified and addressed patients feelings and concerns about diabetes      Individualized Goals (developed by patient)   Reducing Risk Other (comment)   improve blood sugars, decrease medications, prevent diabetes complications, lose weight, lead a healthier lifestyle     Outcomes   Expected Outcomes Demonstrated interest in learning. Expect positive outcomes    Future DMSE PRN    Program Status Not Completed           Individualized Plan for Diabetes Self-Management Training:   Learning Objective:  Patient will have a greater understanding of diabetes self-management. Patient education plan is to attend individual and/or group sessions per assessed needs and concerns.   Plan:   Patient Instructions  Check blood sugars 2 x day before breakfast and 2 hrs after one meal every day Bring blood sugar records to the next MD appointment  Exercise: Walk as tolerated  Eat 3 meals day,  1-2 snacks a day Space meals 4-6 hours apart Don't skip meals - include at least 1 serving of protein and 1 serving of carbohydrates at a meal Limit diet drinks   Make an eye  doctor appointment  Call back if you want to schedule Diabetes classes or an appointment with the nurse or dietitian   Expected Outcomes:  Demonstrated interest in learning. Expect positive outcomes  Education material provided:  General Meal Planning Guidelines Simple Meal Plan  If problems or questions, patient to contact team via:  Sharion Settler, RN, CCM, CDCES [phone]  Future DSME appointment: PRN  The patient wants to try diet recommendations before scheduling any further Diabetes education. She also has access to a dietitian through her husband's employment

## 2020-05-03 NOTE — Patient Instructions (Addendum)
Check blood sugars 2 x day before breakfast and 2 hrs after one meal every day Bring blood sugar records to the next MD appointment  Exercise: Walk as tolerated  Eat 3 meals day,  1-2 snacks a day Space meals 4-6 hours apart Don't skip meals - include at least 1 serving of protein and 1 serving of carbohydrates at a meal Limit diet drinks   Make an eye doctor appointment  Call back if you want to schedule Diabetes classes or an appointment with the nurse or dietitian

## 2020-11-07 ENCOUNTER — Other Ambulatory Visit: Payer: Self-pay | Admitting: Orthopedic Surgery

## 2020-11-07 ENCOUNTER — Encounter: Payer: Self-pay | Admitting: Nurse Practitioner

## 2020-11-07 ENCOUNTER — Ambulatory Visit: Payer: Self-pay | Admitting: Nurse Practitioner

## 2020-11-07 ENCOUNTER — Other Ambulatory Visit: Payer: Self-pay

## 2020-11-07 VITALS — BP 160/100 | HR 119 | Temp 102.4°F | Resp 18

## 2020-11-07 DIAGNOSIS — Z20822 Contact with and (suspected) exposure to covid-19: Secondary | ICD-10-CM

## 2020-11-07 DIAGNOSIS — R110 Nausea: Secondary | ICD-10-CM

## 2020-11-07 LAB — POC COVID19 BINAXNOW: SARS Coronavirus 2 Ag: POSITIVE — AB

## 2020-11-07 MED ORDER — ONDANSETRON 4 MG PO TBDP: 4.0000 mg | ORAL_TABLET | Freq: Three times a day (TID) | ORAL | 0 refills | Status: AC | PRN

## 2020-11-08 NOTE — Progress Notes (Signed)
   Subjective:    Patient ID: Mingo Amber, female    DOB: 1965-04-12, 55 y.o.   MRN: [id number]  HPI  55 year old female presenting to [facility] with acute onset of fever, fatigue, body aches congestion and cough today.  Her husband currently has COVID  Patient has been vaccinated for COVID x2 no booster yet.  She is scheduled for knee surgery on 11/20/2020  She denies a history of asthma or COPD, denies pneumonia or bronchitis She has not had COVID in the past.   Has not taken her medications today-  There is no height or weight on file to calculate BMI.   Review of Systems  Constitutional:  Positive for fatigue and fever.  HENT:  Positive for congestion and sinus pressure.   Respiratory:  Positive for cough.   Cardiovascular: Negative.   Gastrointestinal: Negative.   Genitourinary: Negative.   Musculoskeletal:  Positive for myalgias.  Neurological:  Positive for dizziness.  Hematological: Negative.     Current Outpatient Medications  Medication Instructions   clonazePAM (KLONOPIN) 1-2 mg, Oral, 3 times daily PRN   Empagliflozin-Linaglip-Metform (TRIJARDY XR) 5-2.07-998 MG TB24 1 tablet, Oral, Daily with breakfast   ibuprofen (ADVIL) 800 mg, Oral, Every 6 hours PRN   omeprazole (PRILOSEC) 20 mg, Oral, Daily with supper   ondansetron (ZOFRAN ODT) 4 mg, Oral, Every 8 hours PRN   sertraline (ZOLOFT) 100 mg, Oral, Daily   zolpidem (AMBIEN) 5-10 mg, Oral, Daily at bedtime       Objective:   Physical Exam Constitutional:      General: She is not in acute distress.    Appearance: She is ill-appearing.  HENT:     Head: Normocephalic.  Cardiovascular:     Rate and Rhythm: Normal rate and regular rhythm.     Heart sounds: Normal heart sounds.  Pulmonary:     Effort: Pulmonary effort is normal.     Breath sounds: Normal breath sounds.  Musculoskeletal:     Cervical back: Normal range of motion.  Neurological:     Mental Status: She is alert.    Past  Medical History:  Diagnosis Date   Anxiety    Arthritis    Cough    Depression    Diabetes mellitus without complication (HCC)    Dyspnea    History of kidney stones          Assessment & Plan:  1. Encounter for screening laboratory testing for COVID-19 virus  - POC COVID-19 positive   2. Nausea  - ondansetron (ZOFRAN ODT) 4 MG disintegrating tablet; Take 1 tablet (4 mg total) by mouth every 8 (eight) hours as needed for nausea or vomiting.  Dispense: 30 tablet; Refill: 0    Discussed management of symptoms with over the counter medications. Patient is aware, encouraged to rest, push fluids, and maintain high protein caloric intake. Vitamin C for immune support.   Return to clinic with new or worsening symptoms.  Will send HR note for return to work date She should remain in isolation through 11/11/2020 May leave home/return to work and activities on 11/12/2020 with a mask through 11/16/2020

## 2020-11-09 ENCOUNTER — Inpatient Hospital Stay: Admission: RE | Admit: 2020-11-09 | Payer: BC Managed Care – PPO | Source: Ambulatory Visit

## 2020-11-14 ENCOUNTER — Encounter: Admission: RE | Admit: 2020-11-14 | Payer: BC Managed Care – PPO | Source: Ambulatory Visit

## 2020-11-14 ENCOUNTER — Other Ambulatory Visit: Payer: Self-pay

## 2020-11-14 ENCOUNTER — Ambulatory Visit: Payer: BC Managed Care – PPO | Admitting: Nurse Practitioner

## 2020-11-14 DIAGNOSIS — U071 COVID-19: Secondary | ICD-10-CM

## 2020-11-14 DIAGNOSIS — Z01818 Encounter for other preprocedural examination: Secondary | ICD-10-CM | POA: Insufficient documentation

## 2020-11-14 NOTE — Pre-Procedure Instructions (Signed)
Arrived to preop appointment, had tested positive for covid on 11/07/20 which is only 7 days ago. After confirming with Daleen Snook, nursing director of OR services; patient's can not enter [facility] hospital until 10 days out of positive covid test. Call made to Dr. Samuel Germany office to inform them of this. Patient was rescheduled for her preop interview and testing for Monday, September 12...one day prior to scheduled surgery.

## 2020-11-16 ENCOUNTER — Other Ambulatory Visit: Admission: RE | Admit: 2020-11-16 | Payer: BC Managed Care – PPO | Source: Ambulatory Visit

## 2020-11-16 NOTE — Progress Notes (Signed)
   Spoke with patient over the phone regarding COVID recovery and upcoming surgery. Patient was refused pre op appointment until 10 days post COVID infection. She has rescheduled. She feels much better but not 100%.   She is rescheduled for pre op 9/12 and surgery 9/13   Advised follow up in [facility] for any ongoing symptoms related to COVID

## 2020-11-19 ENCOUNTER — Other Ambulatory Visit: Payer: Self-pay

## 2020-11-19 ENCOUNTER — Ambulatory Visit
Admission: RE | Admit: 2020-11-19 | Discharge: 2020-11-19 | Disposition: A | Discharge status: Home / Self Care | Payer: BC Managed Care – PPO | Source: Ambulatory Visit | Attending: Orthopedic Surgery | Admitting: Orthopedic Surgery

## 2020-11-19 ENCOUNTER — Encounter
Admission: RE | Admit: 2020-11-19 | Discharge: 2020-11-19 | Disposition: A | Discharge status: Home / Self Care | Payer: BC Managed Care – PPO | Source: Ambulatory Visit | Attending: Orthopedic Surgery | Admitting: Orthopedic Surgery

## 2020-11-19 DIAGNOSIS — Z01818 Encounter for other preprocedural examination: Secondary | ICD-10-CM

## 2020-11-19 HISTORY — DX: Personal history of other diseases of the digestive system: Z87.19

## 2020-11-19 HISTORY — DX: Gastro-esophageal reflux disease without esophagitis: K21.9

## 2020-11-19 LAB — URINALYSIS, COMPLETE (UACMP) WITH MICROSCOPIC
Bacteria, UA: NONE SEEN
Bilirubin Urine: NEGATIVE
Glucose, UA: >=500 mg/dL — AB
Hgb urine dipstick: NEGATIVE
Ketones, ur: NEGATIVE mg/dL
Leukocytes,Ua: NEGATIVE
Nitrite: NEGATIVE
Protein, ur: NEGATIVE mg/dL
Specific Gravity, Urine: 1.028 (ref 1.005–1.030)
pH: 6 (ref 5.0–8.0)

## 2020-11-19 LAB — PROTIME-INR
INR: 1 (ref 0.8–1.2)
Prothrombin Time: 12.7 seconds (ref 11.4–15.2)

## 2020-11-19 LAB — BASIC METABOLIC PANEL
Anion gap: 8 (ref 5–15)
BUN: 16 mg/dL (ref 6–20)
CO2: 26 mmol/L (ref 22–32)
Calcium: 9.4 mg/dL (ref 8.9–10.3)
Chloride: 106 mmol/L (ref 98–111)
Creatinine, Ser: 0.57 mg/dL (ref 0.44–1.00)
GFR, Estimated: >60 mL/min (ref >60)
Glucose, Bld: 108 mg/dL — ABNORMAL HIGH (ref 70–99)
Potassium: 3.7 mmol/L (ref 3.5–5.1)
Sodium: 140 mmol/L (ref 135–145)

## 2020-11-19 LAB — CBC WITH DIFFERENTIAL/PLATELET
Abs Immature Granulocytes: 0.06 10*3/uL (ref 0.00–0.07)
Basophils Absolute: 0 10*3/uL (ref 0.0–0.1)
Basophils Relative: 0 %
Eosinophils Absolute: 0.1 10*3/uL (ref 0.0–0.5)
Eosinophils Relative: 1 %
HCT: 44.4 % (ref 36.0–46.0)
Hemoglobin: 14.8 g/dL (ref 12.0–15.0)
Immature Granulocytes: 1 %
Lymphocytes Relative: 29 %
Lymphs Abs: 2.4 10*3/uL (ref 0.7–4.0)
MCH: 28 pg (ref 26.0–34.0)
MCHC: 33.3 g/dL (ref 30.0–36.0)
MCV: 83.9 fL (ref 80.0–100.0)
Monocytes Absolute: 0.4 10*3/uL (ref 0.1–1.0)
Monocytes Relative: 5 %
Neutro Abs: 5.4 10*3/uL (ref 1.7–7.7)
Neutrophils Relative %: 64 %
Platelets: 293 10*3/uL (ref 150–400)
RBC: 5.29 MIL/uL — ABNORMAL HIGH (ref 3.87–5.11)
RDW: 14 % (ref 11.5–15.5)
WBC: 8.3 10*3/uL (ref 4.0–10.5)
nRBC: 0 % (ref 0.0–0.2)

## 2020-11-19 LAB — SURGICAL PCR SCREEN
MRSA, PCR: NEGATIVE
Staphylococcus aureus: NEGATIVE

## 2020-11-19 LAB — APTT: aPTT: 31 seconds (ref 24–36)

## 2020-11-19 NOTE — Patient Instructions (Addendum)
Your procedure is scheduled on: Tuesday, September 13 Report to the Registration Desk on the 1st floor of the [facility] To find out your arrival time, please call [phone] between 1PM - 3PM on: Monday, September 12  REMEMBER: Instructions that are not followed completely may result in serious medical risk, up to and including death; or upon the discretion of your surgeon and anesthesiologist your surgery may need to be rescheduled.  Do not eat food after midnight the night before surgery.  No gum chewing, lozengers or hard candies.  You may however, drink water up to 2 hours before you are scheduled to arrive for your surgery. Do not drink anything within 2 hours of your scheduled arrival time.  TAKE THESE MEDICATIONS THE MORNING OF SURGERY WITH A SIP OF WATER:  Omeprazole (Prilosec) - (take one the night before and one on the morning of surgery - helps to prevent nausea after surgery.) Sertraline (Zoloft)  Use inhalers on the day of surgery and bring to the hospital.  Stop Trijardy medication for 3 days prior to surgery.  One week prior to surgery: Stop Anti-inflammatories (NSAIDS) such as Advil, Aleve, Ibuprofen, Motrin, Naproxen, Naprosyn and Aspirin based products such as Excedrin, Goodys Powder, BC Powder. Stop ANY OVER THE COUNTER supplements until after surgery. You may however, continue to take Tylenol if needed for pain up until the day of surgery.  No Alcohol for 24 hours before or after surgery.  No Smoking including e-cigarettes for 24 hours prior to surgery.  No chewable tobacco products for at least 6 hours prior to surgery.  No nicotine patches on the day of surgery.  Do not use any "recreational" drugs for at least a week prior to your surgery.  Please be advised that the combination of cocaine and anesthesia may have negative outcomes, up to and including death. If you test positive for cocaine, your surgery will be cancelled.  On the morning of surgery  brush your teeth with toothpaste and water, you may rinse your mouth with mouthwash if you wish. Do not swallow any toothpaste or mouthwash.  Do not wear jewelry, make-up, hairpins, clips or nail polish.  Do not wear lotions, powders, or perfumes.   Do not shave body from the neck down 48 hours prior to surgery just in case you cut yourself which could leave a site for infection.  Also, freshly shaved skin may become irritated if using the CHG soap.  Contact lenses, hearing aids and dentures may not be worn into surgery.  Do not bring valuables to the hospital. [facility] is not responsible for any missing/lost belongings or valuables.   Use CHG Soap as directed on instruction sheet.  Notify your doctor if there is any change in your medical condition (cold, fever, infection).  Wear comfortable clothing (specific to your surgery type) to the hospital.  After surgery, you can help prevent lung complications by doing breathing exercises.  Take deep breaths and cough every 1-2 hours. Your doctor may order a device called an Incentive Spirometer to help you take deep breaths.  If you are being admitted to the hospital overnight, leave your suitcase in the car. After surgery it may be brought to your room.  If you are taking public transportation, you will need to have a responsible adult (18 years or older) with you. Please confirm with your physician that it is acceptable to use public transportation.   Please call the Pre-admissions Testing Dept. at [phone] if you  have any questions about these instructions.  Surgery Visitation Policy:  Patients undergoing a surgery or procedure may have one family member or support person with them as long as that person is not COVID-19 positive or experiencing its symptoms.  That person may remain in the waiting area during the procedure.  Inpatient Visitation:    Visiting hours are 7 a.m. to 8 p.m. Inpatients will be allowed two  visitors daily. The visitors may change each day during the patient's stay. No visitors under the age of 3. Any visitor under the age of 23 must be accompanied by an adult. The visitor must pass COVID-19 screenings, use hand sanitizer when entering and exiting the patient's room and wear a mask at all times, including in the patient's room. Patients must also wear a mask when staff or their visitor are in the room. Masking is required regardless of vaccination status.

## 2020-11-19 NOTE — Progress Notes (Signed)
Requesting to be approved to go to a rehab facility after surgery because she has no support from her husband at home and her adult children do not live nearby.

## 2020-11-20 ENCOUNTER — Inpatient Hospital Stay
Admission: AD | Admit: 2020-11-20 | Discharge: 2020-11-30 | DRG: 470 | Disposition: A | Discharge status: Skilled Nursing Facility | Payer: BC Managed Care – PPO | Attending: Orthopedic Surgery | Admitting: Orthopedic Surgery

## 2020-11-20 ENCOUNTER — Ambulatory Visit: Payer: BC Managed Care – PPO | Admitting: Urgent Care

## 2020-11-20 ENCOUNTER — Observation Stay: Payer: BC Managed Care – PPO

## 2020-11-20 ENCOUNTER — Encounter: Payer: Self-pay | Admitting: Orthopedic Surgery

## 2020-11-20 ENCOUNTER — Encounter
Admission: AD | Disposition: A | Discharge status: Skilled Nursing Facility | Payer: Self-pay | Source: Home / Self Care | Attending: Orthopedic Surgery

## 2020-11-20 ENCOUNTER — Other Ambulatory Visit: Payer: Self-pay | Admitting: Orthopedic Surgery

## 2020-11-20 DIAGNOSIS — Z833 Family history of diabetes mellitus: Secondary | ICD-10-CM

## 2020-11-20 DIAGNOSIS — E119 Type 2 diabetes mellitus without complications: Secondary | ICD-10-CM | POA: Diagnosis present

## 2020-11-20 DIAGNOSIS — Z87891 Personal history of nicotine dependence: Secondary | ICD-10-CM

## 2020-11-20 DIAGNOSIS — R110 Nausea: Secondary | ICD-10-CM | POA: Diagnosis not present

## 2020-11-20 DIAGNOSIS — Z8616 Personal history of COVID-19: Secondary | ICD-10-CM

## 2020-11-20 DIAGNOSIS — R1012 Left upper quadrant pain: Secondary | ICD-10-CM | POA: Diagnosis not present

## 2020-11-20 DIAGNOSIS — Z7984 Long term (current) use of oral hypoglycemic drugs: Secondary | ICD-10-CM

## 2020-11-20 DIAGNOSIS — K219 Gastro-esophageal reflux disease without esophagitis: Secondary | ICD-10-CM | POA: Diagnosis present

## 2020-11-20 DIAGNOSIS — Z87442 Personal history of urinary calculi: Secondary | ICD-10-CM

## 2020-11-20 DIAGNOSIS — Z90710 Acquired absence of both cervix and uterus: Secondary | ICD-10-CM

## 2020-11-20 DIAGNOSIS — Z79899 Other long term (current) drug therapy: Secondary | ICD-10-CM

## 2020-11-20 DIAGNOSIS — G4730 Sleep apnea, unspecified: Secondary | ICD-10-CM | POA: Diagnosis present

## 2020-11-20 DIAGNOSIS — Z881 Allergy status to other antibiotic agents status: Secondary | ICD-10-CM

## 2020-11-20 DIAGNOSIS — F32A Depression, unspecified: Secondary | ICD-10-CM | POA: Diagnosis present

## 2020-11-20 DIAGNOSIS — Z20822 Contact with and (suspected) exposure to covid-19: Secondary | ICD-10-CM | POA: Diagnosis present

## 2020-11-20 DIAGNOSIS — Z96652 Presence of left artificial knee joint: Secondary | ICD-10-CM

## 2020-11-20 DIAGNOSIS — Z90721 Acquired absence of ovaries, unilateral: Secondary | ICD-10-CM

## 2020-11-20 DIAGNOSIS — M1712 Unilateral primary osteoarthritis, left knee: Principal | ICD-10-CM | POA: Diagnosis present

## 2020-11-20 DIAGNOSIS — I951 Orthostatic hypotension: Secondary | ICD-10-CM | POA: Diagnosis not present

## 2020-11-20 DIAGNOSIS — F419 Anxiety disorder, unspecified: Secondary | ICD-10-CM | POA: Diagnosis present

## 2020-11-20 HISTORY — PX: TOTAL KNEE ARTHROPLASTY: SHX125

## 2020-11-20 LAB — TYPE AND SCREEN
ABO/RH(D): O POS
Antibody Screen: NEGATIVE

## 2020-11-20 LAB — GLUCOSE, CAPILLARY
Glucose-Capillary: 148 mg/dL — ABNORMAL HIGH (ref 70–99)
Glucose-Capillary: 153 mg/dL — ABNORMAL HIGH (ref 70–99)
Glucose-Capillary: 113 mg/dL — ABNORMAL HIGH (ref 70–99)
Glucose-Capillary: 182 mg/dL — ABNORMAL HIGH (ref 70–99)

## 2020-11-20 SURGERY — ARTHROPLASTY, KNEE, TOTAL
Anesthesia: Spinal | Site: Knee | Laterality: Left

## 2020-11-20 MED ORDER — TRAMADOL HCL 50 MG PO TABS
50.0000 mg | ORAL_TABLET | Freq: Four times a day (QID) | ORAL | Status: DC
Start: 2020-11-20 — End: 2020-11-28
  Administered 2020-11-20 – 2020-11-28 (×28): 50 mg via ORAL
  Filled 2020-11-20 (×28): qty 1

## 2020-11-20 MED ORDER — DAPAGLIFLOZIN PROPANEDIOL 5 MG PO TABS
5.0000 mg | ORAL_TABLET | Freq: Every day | ORAL | Status: DC
  Administered 2020-11-21 – 2020-11-30 (×8): 5 mg via ORAL
  Filled 2020-11-20 (×10): qty 1

## 2020-11-20 MED ORDER — ORAL CARE MOUTH RINSE: 15.0000 mL | Freq: Once | OROMUCOSAL | Status: DC

## 2020-11-20 MED ORDER — SODIUM CHLORIDE 0.9 % IV SOLN: INTRAVENOUS | Status: DC

## 2020-11-20 MED ORDER — OXYCODONE HCL 5 MG PO TABS
5.0000 mg | ORAL_TABLET | Freq: Once | ORAL | Status: AC | PRN
  Administered 2020-11-20: 5 mg via ORAL

## 2020-11-20 MED ORDER — FENTANYL CITRATE (PF) 100 MCG/2ML IJ SOLN
25.0000 ug | INTRAMUSCULAR | Status: DC | PRN
  Administered 2020-11-20 (×2): 50 ug via INTRAVENOUS

## 2020-11-20 MED ORDER — FENTANYL CITRATE (PF) 100 MCG/2ML IJ SOLN
INTRAMUSCULAR | Status: DC | PRN
  Administered 2020-11-20 (×2): 50 ug via INTRAVENOUS

## 2020-11-20 MED ORDER — CLONAZEPAM 0.5 MG PO TABS
1.0000 mg | ORAL_TABLET | Freq: Three times a day (TID) | ORAL | Status: DC | PRN
  Administered 2020-11-22: 1 mg via ORAL
  Administered 2020-11-23 – 2020-11-29 (×11): 2 mg via ORAL
  Administered 2020-11-30 (×2): 1 mg via ORAL
  Filled 2020-11-20 (×5): qty 4
  Filled 2020-11-20: qty 2
  Filled 2020-11-20 (×3): qty 4
  Filled 2020-11-20: qty 2
  Filled 2020-11-20 (×5): qty 4

## 2020-11-20 MED ORDER — MORPHINE SULFATE 4 MG/ML IJ SOLN
INTRAMUSCULAR | Status: DC | PRN
  Administered 2020-11-20: 62 mL via INTRAMUSCULAR

## 2020-11-20 MED ORDER — NEOMYCIN-POLYMYXIN B GU 40-200000 IR SOLN
Status: AC
  Filled 2020-11-20: qty 20

## 2020-11-20 MED ORDER — SODIUM CHLORIDE FLUSH 0.9 % IV SOLN
INTRAVENOUS | Status: AC
  Filled 2020-11-20: qty 40

## 2020-11-20 MED ORDER — SODIUM CHLORIDE 0.9 % IR SOLN
Status: DC | PRN
  Administered 2020-11-20: 3000 mL

## 2020-11-20 MED ORDER — SODIUM CHLORIDE 0.9 % IV SOLN
INTRAVENOUS | Status: DC | PRN
  Administered 2020-11-20: 20 ug/min via INTRAVENOUS

## 2020-11-20 MED ORDER — ACETAMINOPHEN 500 MG PO TABS: 1000.0000 mg | ORAL_TABLET | ORAL | Status: AC

## 2020-11-20 MED ORDER — CHLORHEXIDINE GLUCONATE 0.12 % MT SOLN: 15.0000 mL | Freq: Once | OROMUCOSAL | Status: DC

## 2020-11-20 MED ORDER — PROPOFOL 1000 MG/100ML IV EMUL
INTRAVENOUS | Status: AC
  Filled 2020-11-20: qty 100

## 2020-11-20 MED ORDER — LINAGLIPTIN 5 MG PO TABS
2.5000 mg | ORAL_TABLET | Freq: Every day | ORAL | Status: DC
  Administered 2020-11-21 – 2020-11-30 (×8): 2.5 mg via ORAL
  Filled 2020-11-20 (×10): qty 1

## 2020-11-20 MED ORDER — TRANEXAMIC ACID-NACL 1000-0.7 MG/100ML-% IV SOLN
1000.0000 mg | INTRAVENOUS | Status: AC
  Administered 2020-11-20: 1000 mg via INTRAVENOUS

## 2020-11-20 MED ORDER — INSULIN ASPART 100 UNIT/ML IJ SOLN: 0.0000 [IU] | Freq: Every day | INTRAMUSCULAR | Status: DC

## 2020-11-20 MED ORDER — BISACODYL 10 MG RE SUPP: 10.0000 mg | Freq: Every day | RECTAL | Status: DC | PRN

## 2020-11-20 MED ORDER — TRANEXAMIC ACID-NACL 1000-0.7 MG/100ML-% IV SOLN
INTRAVENOUS | Status: AC
  Filled 2020-11-20: qty 100

## 2020-11-20 MED ORDER — ACETAMINOPHEN 500 MG PO TABS
ORAL_TABLET | ORAL | Status: AC
  Administered 2020-11-20: 1000 mg via ORAL
  Filled 2020-11-20: qty 2

## 2020-11-20 MED ORDER — FENTANYL CITRATE (PF) 100 MCG/2ML IJ SOLN
INTRAMUSCULAR | Status: AC
  Administered 2020-11-20: 50 ug via INTRAVENOUS
  Filled 2020-11-20: qty 2

## 2020-11-20 MED ORDER — METOCLOPRAMIDE HCL 5 MG/ML IJ SOLN: 5.0000 mg | Freq: Three times a day (TID) | INTRAMUSCULAR | Status: DC | PRN

## 2020-11-20 MED ORDER — KETOROLAC TROMETHAMINE 30 MG/ML IJ SOLN
INTRAMUSCULAR | Status: AC
  Filled 2020-11-20: qty 1

## 2020-11-20 MED ORDER — SENNOSIDES-DOCUSATE SODIUM 8.6-50 MG PO TABS
1.0000 | ORAL_TABLET | Freq: Every evening | ORAL | Status: DC | PRN
  Administered 2020-11-24: 1 via ORAL
  Filled 2020-11-20: qty 1

## 2020-11-20 MED ORDER — DEXMEDETOMIDINE (PRECEDEX) IN NS 20 MCG/5ML (4 MCG/ML) IV SYRINGE
PREFILLED_SYRINGE | INTRAVENOUS | Status: DC | PRN
  Administered 2020-11-20: 4 ug via INTRAVENOUS
  Administered 2020-11-20: 8 ug via INTRAVENOUS
  Administered 2020-11-20 (×2): 4 ug via INTRAVENOUS

## 2020-11-20 MED ORDER — 0.9 % SODIUM CHLORIDE (POUR BTL) OPTIME
TOPICAL | Status: DC | PRN
  Administered 2020-11-20: 500 mL

## 2020-11-20 MED ORDER — HYDROMORPHONE HCL 1 MG/ML IJ SOLN
0.5000 mg | INTRAMUSCULAR | Status: DC | PRN
  Administered 2020-11-20 – 2020-11-25 (×6): 1 mg via INTRAVENOUS
  Filled 2020-11-20 (×7): qty 1

## 2020-11-20 MED ORDER — LACTATED RINGERS IV SOLN: INTRAVENOUS | Status: DC | PRN

## 2020-11-20 MED ORDER — STERILE WATER FOR IRRIGATION IR SOLN
Status: DC | PRN
  Administered 2020-11-20: 500 mL

## 2020-11-20 MED ORDER — ENOXAPARIN SODIUM 40 MG/0.4ML IJ SOSY
40.0000 mg | PREFILLED_SYRINGE | INTRAMUSCULAR | Status: DC
  Administered 2020-11-21 – 2020-11-30 (×10): 40 mg via SUBCUTANEOUS
  Filled 2020-11-20 (×10): qty 0.4

## 2020-11-20 MED ORDER — DOCUSATE SODIUM 100 MG PO CAPS
100.0000 mg | ORAL_CAPSULE | Freq: Two times a day (BID) | ORAL | Status: DC
  Administered 2020-11-20 – 2020-11-30 (×21): 100 mg via ORAL
  Filled 2020-11-20 (×21): qty 1

## 2020-11-20 MED ORDER — BUPIVACAINE HCL (PF) 0.5 % IJ SOLN
INTRAMUSCULAR | Status: DC | PRN
  Administered 2020-11-20: 2.7 mL

## 2020-11-20 MED ORDER — FLEET ENEMA 7-19 GM/118ML RE ENEM: 1.0000 | ENEMA | Freq: Once | RECTAL | Status: DC | PRN

## 2020-11-20 MED ORDER — PHENYLEPHRINE HCL (PRESSORS) 10 MG/ML IV SOLN
INTRAVENOUS | Status: AC
  Filled 2020-11-20: qty 1

## 2020-11-20 MED ORDER — METHOCARBAMOL 500 MG PO TABS
ORAL_TABLET | ORAL | Status: AC
  Administered 2020-11-20: 500 mg
  Filled 2020-11-20: qty 1

## 2020-11-20 MED ORDER — EMPAGLIFLOZIN-LINAGLIP-METFORM 5-2.5-1000 MG PO TB24: 1.0000 | ORAL_TABLET | Freq: Every day | ORAL | Status: DC

## 2020-11-20 MED ORDER — ACETAMINOPHEN 500 MG PO TABS
1000.0000 mg | ORAL_TABLET | Freq: Four times a day (QID) | ORAL | Status: AC
  Administered 2020-11-20 – 2020-11-21 (×4): 1000 mg via ORAL
  Filled 2020-11-20 (×4): qty 2

## 2020-11-20 MED ORDER — ACETAMINOPHEN 325 MG PO TABS: 325.0000 mg | ORAL_TABLET | Freq: Four times a day (QID) | ORAL | Status: DC | PRN

## 2020-11-20 MED ORDER — CEFAZOLIN SODIUM-DEXTROSE 2-4 GM/100ML-% IV SOLN
INTRAVENOUS | Status: AC
  Administered 2020-11-20: 2 g via INTRAVENOUS
  Filled 2020-11-20: qty 100

## 2020-11-20 MED ORDER — MIDAZOLAM HCL 5 MG/5ML IJ SOLN
INTRAMUSCULAR | Status: DC | PRN
  Administered 2020-11-20: 2 mg via INTRAVENOUS

## 2020-11-20 MED ORDER — CEFAZOLIN SODIUM-DEXTROSE 2-4 GM/100ML-% IV SOLN
2.0000 g | INTRAVENOUS | Status: AC
  Administered 2020-11-20: 2 g via INTRAVENOUS

## 2020-11-20 MED ORDER — FENTANYL CITRATE (PF) 100 MCG/2ML IJ SOLN
INTRAMUSCULAR | Status: AC
  Filled 2020-11-20: qty 2

## 2020-11-20 MED ORDER — METOCLOPRAMIDE HCL 10 MG PO TABS
5.0000 mg | ORAL_TABLET | Freq: Three times a day (TID) | ORAL | Status: DC | PRN
  Administered 2020-11-20 – 2020-11-24 (×4): 10 mg via ORAL
  Filled 2020-11-20 (×4): qty 1

## 2020-11-20 MED ORDER — OXYCODONE HCL 5 MG PO TABS
5.0000 mg | ORAL_TABLET | ORAL | Status: DC | PRN
  Administered 2020-11-22 – 2020-11-28 (×4): 10 mg via ORAL
  Filled 2020-11-20 (×4): qty 2

## 2020-11-20 MED ORDER — METHOCARBAMOL 500 MG PO TABS
500.0000 mg | ORAL_TABLET | Freq: Four times a day (QID) | ORAL | Status: DC | PRN
  Administered 2020-11-20 – 2020-11-30 (×18): 500 mg via ORAL
  Filled 2020-11-20 (×20): qty 1

## 2020-11-20 MED ORDER — CHLORHEXIDINE GLUCONATE CLOTH 2 % EX PADS: 6.0000 | MEDICATED_PAD | Freq: Once | CUTANEOUS | Status: DC

## 2020-11-20 MED ORDER — BUPIVACAINE LIPOSOME 1.3 % IJ SUSP
INTRAMUSCULAR | Status: AC
  Filled 2020-11-20: qty 20

## 2020-11-20 MED ORDER — SERTRALINE HCL 100 MG PO TABS
100.0000 mg | ORAL_TABLET | Freq: Every day | ORAL | Status: DC
  Administered 2020-11-20 – 2020-11-30 (×11): 100 mg via ORAL
  Filled 2020-11-20: qty 2
  Filled 2020-11-20: qty 1
  Filled 2020-11-20: qty 2
  Filled 2020-11-20 (×2): qty 1
  Filled 2020-11-20 (×3): qty 2
  Filled 2020-11-20 (×4): qty 1
  Filled 2020-11-20 (×3): qty 2
  Filled 2020-11-20 (×4): qty 1

## 2020-11-20 MED ORDER — CHLORHEXIDINE GLUCONATE 0.12 % MT SOLN
OROMUCOSAL | Status: AC
  Filled 2020-11-20: qty 15

## 2020-11-20 MED ORDER — OXYCODONE HCL 5 MG PO TABS
10.0000 mg | ORAL_TABLET | ORAL | Status: DC | PRN
  Administered 2020-11-21 – 2020-11-30 (×38): 15 mg via ORAL
  Filled 2020-11-20 (×40): qty 3

## 2020-11-20 MED ORDER — OXYCODONE HCL 5 MG PO TABS
ORAL_TABLET | ORAL | Status: AC
  Filled 2020-11-20: qty 1

## 2020-11-20 MED ORDER — DIPHENHYDRAMINE HCL 12.5 MG/5ML PO ELIX: 12.5000 mg | ORAL_SOLUTION | ORAL | Status: DC | PRN

## 2020-11-20 MED ORDER — SODIUM CHLORIDE (PF) 0.9 % IJ SOLN
INTRAMUSCULAR | Status: DC | PRN
  Administered 2020-11-20: 60 mL via INTRAMUSCULAR

## 2020-11-20 MED ORDER — OXYCODONE HCL 5 MG/5ML PO SOLN: 5.0000 mg | Freq: Once | ORAL | Status: AC | PRN

## 2020-11-20 MED ORDER — ONDANSETRON HCL 4 MG/2ML IJ SOLN
INTRAMUSCULAR | Status: DC | PRN
  Administered 2020-11-20: 4 mg via INTRAVENOUS

## 2020-11-20 MED ORDER — CEFAZOLIN SODIUM-DEXTROSE 2-4 GM/100ML-% IV SOLN
2.0000 g | Freq: Four times a day (QID) | INTRAVENOUS | Status: AC
Start: 2020-11-20 — End: 2020-11-21
  Administered 2020-11-20: 2 g via INTRAVENOUS
  Filled 2020-11-20 (×2): qty 100

## 2020-11-20 MED ORDER — METFORMIN HCL ER 500 MG PO TB24
1000.0000 mg | ORAL_TABLET | Freq: Every day | ORAL | Status: DC
  Administered 2020-11-21 – 2020-11-30 (×9): 1000 mg via ORAL
  Filled 2020-11-20 (×10): qty 2

## 2020-11-20 MED ORDER — CLINDAMYCIN PHOSPHATE 600 MG/50ML IV SOLN
INTRAVENOUS | Status: AC
  Filled 2020-11-20: qty 50

## 2020-11-20 MED ORDER — PHENYLEPHRINE HCL (PRESSORS) 10 MG/ML IV SOLN
INTRAVENOUS | Status: DC | PRN
  Administered 2020-11-20 (×4): 100 ug via INTRAVENOUS

## 2020-11-20 MED ORDER — BUPIVACAINE-EPINEPHRINE (PF) 0.25% -1:200000 IJ SOLN
INTRAMUSCULAR | Status: AC
  Filled 2020-11-20: qty 60

## 2020-11-20 MED ORDER — METHOCARBAMOL 1000 MG/10ML IJ SOLN
500.0000 mg | Freq: Four times a day (QID) | INTRAVENOUS | Status: DC | PRN
  Filled 2020-11-20: qty 5

## 2020-11-20 MED ORDER — INSULIN ASPART 100 UNIT/ML IJ SOLN
0.0000 [IU] | Freq: Three times a day (TID) | INTRAMUSCULAR | Status: DC
  Administered 2020-11-21: 2 [IU] via SUBCUTANEOUS
  Administered 2020-11-21: 3 [IU] via SUBCUTANEOUS
  Administered 2020-11-22 – 2020-11-29 (×3): 2 [IU] via SUBCUTANEOUS
  Filled 2020-11-20 (×5): qty 1

## 2020-11-20 MED ORDER — ONDANSETRON HCL 4 MG/2ML IJ SOLN
4.0000 mg | Freq: Four times a day (QID) | INTRAMUSCULAR | Status: DC | PRN
  Administered 2020-11-20 – 2020-11-22 (×4): 4 mg via INTRAVENOUS
  Filled 2020-11-20 (×4): qty 2

## 2020-11-20 MED ORDER — ONDANSETRON HCL 4 MG PO TABS
4.0000 mg | ORAL_TABLET | Freq: Four times a day (QID) | ORAL | Status: DC | PRN
  Administered 2020-11-22 – 2020-11-24 (×2): 4 mg via ORAL
  Filled 2020-11-20 (×2): qty 1

## 2020-11-20 MED ORDER — CLINDAMYCIN PHOSPHATE 600 MG/50ML IV SOLN
600.0000 mg | Freq: Once | INTRAVENOUS | Status: AC
  Administered 2020-11-20: 900 mg via INTRAVENOUS

## 2020-11-20 MED ORDER — PANTOPRAZOLE SODIUM 40 MG PO TBEC
40.0000 mg | DELAYED_RELEASE_TABLET | Freq: Every day | ORAL | Status: DC
  Administered 2020-11-20 – 2020-11-30 (×11): 40 mg via ORAL
  Filled 2020-11-20 (×10): qty 1

## 2020-11-20 MED ORDER — MORPHINE SULFATE (PF) 4 MG/ML IV SOLN
INTRAVENOUS | Status: AC
  Filled 2020-11-20: qty 1

## 2020-11-20 MED ORDER — MIDAZOLAM HCL 2 MG/2ML IJ SOLN
INTRAMUSCULAR | Status: AC
  Filled 2020-11-20: qty 2

## 2020-11-20 MED ORDER — PROPOFOL 500 MG/50ML IV EMUL
INTRAVENOUS | Status: DC | PRN
  Administered 2020-11-20: 150 ug/kg/min via INTRAVENOUS

## 2020-11-20 SURGICAL SUPPLY — 76 items
BLADE CLIPPER SURG (BLADE) ×2 IMPLANT
BLADE SAW 90X13X1.19 OSCILLAT (BLADE) ×4 IMPLANT
BLADE SAW 90X25X1.19 OSCILLAT (BLADE) ×2 IMPLANT
CEMENT HV SMART SET (Cement) ×4 IMPLANT
CEMENT TIBIA MBT SIZE 2.5 (Knees) ×1 IMPLANT
CNTNR SPEC 2.5X3XGRAD LEK (MISCELLANEOUS) ×1
COMP FEM CEM PS LT SZ 2 (Knees) ×2 IMPLANT
COMPONENT FEM CEM PS LT SZ 2 (Knees) ×1 IMPLANT
CONT SPEC 4OZ STER OR WHT (MISCELLANEOUS) ×1
CONTAINER SPEC 2.5X3XGRAD LEK (MISCELLANEOUS) ×1 IMPLANT
COOLER POLAR GLACIER W/PUMP (MISCELLANEOUS) ×2 IMPLANT
CUFF TOURN SGL QUICK 24 (TOURNIQUET CUFF)
CUFF TOURN SGL QUICK 34 (TOURNIQUET CUFF)
CUFF TRNQT CYL 24X4X16.5-23 (TOURNIQUET CUFF) IMPLANT
CUFF TRNQT CYL 34X4.125X (TOURNIQUET CUFF) IMPLANT
DRAPE 3/4 80X56 (DRAPES) ×4 IMPLANT
DRAPE IMP U-DRAPE 54X76 (DRAPES) ×4 IMPLANT
DRAPE INCISE IOBAN 66X60 STRL (DRAPES) ×2 IMPLANT
DRAPE SURG 17X11 SM STRL (DRAPES) ×4 IMPLANT
DRSG AQUACEL AG ADV 3.5X10 (GAUZE/BANDAGES/DRESSINGS) ×2 IMPLANT
DURAPREP 26ML APPLICATOR (WOUND CARE) ×8 IMPLANT
ELECT REM PT RETURN 9FT ADLT (ELECTROSURGICAL) ×2
ELECTRODE REM PT RTRN 9FT ADLT (ELECTROSURGICAL) ×1 IMPLANT
GAUZE 4X4 16PLY ~~LOC~~+RFID DBL (SPONGE) ×2 IMPLANT
GAUZE SPONGE 4X4 12PLY STRL (GAUZE/BANDAGES/DRESSINGS) ×2 IMPLANT
GLOVE SRG 8 PF TXTR STRL LF DI (GLOVE) ×2 IMPLANT
GLOVE SURG ENC TEXT LTX SZ7.5 (GLOVE) ×2 IMPLANT
GLOVE SURG ORTHO LTX SZ9 (GLOVE) ×4 IMPLANT
GLOVE SURG SYN 7.5  E (GLOVE) ×1
GLOVE SURG SYN 7.5 E (GLOVE) ×1 IMPLANT
GLOVE SURG UNDER POLY LF SZ8 (GLOVE) ×2
GLOVE SURG UNDER POLY LF SZ9 (GLOVE) ×2 IMPLANT
GOWN STRL REUS TWL 2XL XL LVL4 (GOWN DISPOSABLE) ×2 IMPLANT
GOWN STRL REUS W/ TWL LRG LVL3 (GOWN DISPOSABLE) ×1 IMPLANT
GOWN STRL REUS W/ TWL LRG LVL4 (GOWN DISPOSABLE) ×1 IMPLANT
GOWN STRL REUS W/ TWL XL LVL3 (GOWN DISPOSABLE) ×1 IMPLANT
GOWN STRL REUS W/TWL LRG LVL3 (GOWN DISPOSABLE) ×1
GOWN STRL REUS W/TWL LRG LVL4 (GOWN DISPOSABLE) ×1
GOWN STRL REUS W/TWL XL LVL3 (GOWN DISPOSABLE) ×1
HOLDER FOLEY CATH W/STRAP (MISCELLANEOUS) ×2 IMPLANT
IMMBOLIZER KNEE 19 BLUE UNIV (SOFTGOODS) ×2 IMPLANT
INSERT PFC SIG STB SZ 2 12.5MM (Knees) ×2 IMPLANT
IV NS IRRIG 3000ML ARTHROMATIC (IV SOLUTION) ×2 IMPLANT
KIT TURNOVER KIT A (KITS) ×2 IMPLANT
MANIFOLD NEPTUNE II (INSTRUMENTS) ×4 IMPLANT
NDL SAFETY ECLIPSE 18X1.5 (NEEDLE) ×1 IMPLANT
NEEDLE HYPO 18GX1.5 SHARP (NEEDLE) ×1
NEEDLE HYPO 22GX1.5 SAFETY (NEEDLE) ×2 IMPLANT
NEEDLE SPNL 20GX3.5 QUINCKE YW (NEEDLE) ×2 IMPLANT
NS IRRIG 1000ML POUR BTL (IV SOLUTION) IMPLANT
NS IRRIG 500ML POUR BTL (IV SOLUTION) ×2 IMPLANT
PACK TOTAL KNEE (MISCELLANEOUS) ×2 IMPLANT
PAD WRAPON POLAR KNEE (MISCELLANEOUS) ×1 IMPLANT
PATELLA DOME PFC 35MM (Knees) ×2 IMPLANT
PENCIL SMOKE EVACUATOR COATED (MISCELLANEOUS) ×2 IMPLANT
PIN DRILL FIX HALF THREAD (BIT) ×2 IMPLANT
PIN DRILL QUICK PACK ×2 IMPLANT
PIN FIXATION 1/8DIA X 3INL (PIN) ×2 IMPLANT
PULSAVAC PLUS IRRIG FAN TIP (DISPOSABLE) ×2
SPONGE T-LAP 18X18 ~~LOC~~+RFID (SPONGE) ×6 IMPLANT
STAPLER SKIN PROX 35W (STAPLE) ×2 IMPLANT
SUCTION FRAZIER HANDLE 10FR (MISCELLANEOUS) ×1
SUCTION TUBE FRAZIER 10FR DISP (MISCELLANEOUS) ×1 IMPLANT
SUT ETHIBOND NAB CT1 #1 30IN (SUTURE) ×4 IMPLANT
SUT VIC AB 0 CT1 36 (SUTURE) ×2 IMPLANT
SUT VIC AB 2-0 CT1 (SUTURE) ×4 IMPLANT
SYR 20ML LL LF (SYRINGE) ×2 IMPLANT
SYR 30ML LL (SYRINGE) ×4 IMPLANT
TIBIA MBT CEMENT SIZE 2.5 (Knees) ×2 IMPLANT
TIP FAN IRRIG PULSAVAC PLUS (DISPOSABLE) ×1 IMPLANT
TIP FEMORAL FAN SPRAY W/SHIELD (MISCELLANEOUS) ×2 IMPLANT
TOWER CARTRIDGE SMART MIX (DISPOSABLE) ×2 IMPLANT
TRAY FOLEY MTR SLVR 16FR STAT (SET/KITS/TRAYS/PACK) ×2 IMPLANT
TUBE SUCT KAM VAC (TUBING) ×2 IMPLANT
WATER STERILE IRR 500ML POUR (IV SOLUTION) ×2 IMPLANT
WRAPON POLAR PAD KNEE (MISCELLANEOUS) ×2

## 2020-11-20 NOTE — Progress Notes (Addendum)
  Subjective:  POST OP CHECK s/p left total knee arthroplasty.   Patient reports left knee pain as moderate.    Objective:   VITALS:   Vitals:   11/20/20 1207 11/20/20 1212 11/20/20 1215 11/20/20 1228  BP:   (!) 147/94 (!) 148/86  Pulse: 77 77 67 70  Resp: 15 16 (!) 9 17  Temp:    (!) 97.3 F (36.3 C)  TempSrc:      SpO2: 95% 100% 97% 99%  Weight:      Height:        PHYSICAL EXAM: Left lower extremity: Bandage remains clean dry and intact.  Compartments are soft and compressible.  Patient has palpable pedal pulses, intact sensation light touch and intact motor function distally.  She can flex and extend her toes and dorsiflex and plantarflex her ankle.   LABS  Results for orders placed or performed during the hospital encounter of 11/20/20 (from the past 24 hour(s))  Glucose, capillary     Status: Abnormal   Collection Time: 11/20/20  6:18 AM  Result Value Ref Range   Glucose-Capillary 148 (H) 70 - 99 mg/dL  Glucose, capillary     Status: Abnormal   Collection Time: 11/20/20 11:16 AM  Result Value Ref Range   Glucose-Capillary 153 (H) 70 - 99 mg/dL    Chest 2 View  Result Date: 11/19/2020 CLINICAL DATA:  55 year old female with a history of preoperative chest x-ray EXAM: CHEST - 2 VIEW COMPARISON:  03/09/2018 FINDINGS: Cardiomediastinal silhouette unchanged in size and contour. No evidence of central vascular congestion. No interlobular septal thickening. No pneumothorax or pleural effusion. Coarsened interstitial markings, with no confluent airspace disease. No acute displaced fracture. Degenerative changes of the spine. IMPRESSION: No active cardiopulmonary disease. Electronically Signed   By: Gilmer Mor D.O.   On: 11/19/2020 14:38   DG Knee Left Port  Result Date: 11/20/2020 CLINICAL DATA:  Postop knee replacement EXAM: PORTABLE LEFT KNEE - 1-2 VIEW COMPARISON:  None. FINDINGS: Two views study shows tricompartmental knee replacement. Gas in the soft tissues is  compatible with the surgery. No evidence for immediate hardware complication. IMPRESSION: Status post tricompartmental knee replacement. No evidence for immediate hardware complication. Electronically Signed   By: Kennith Center M.D.   On: 11/20/2020 11:59    Assessment/Plan: Day of Surgery   Active Problems:   S/P TKR (total knee replacement) using cement, left  Patient is stable postop.  She will compleat 24 hours of postop antibiotics.  Foley catheter will be removed in the morning.  Patient will have labs drawn in the a.m.  Postoperative x-ray shows that the total knee arthroplasty components are well-positioned.  There is no evidence of postop complication.  Continue current pain management.  Orders placed for tight blood sugar control.  She will begin physical therapy this afternoon we will continue throughout her hospitalization.  Social work consult is placed and I spoken with Deliliah regarding this patient and postoperative planning.    Juanell Fairly , MD 11/20/2020, 2:22 PM

## 2020-11-20 NOTE — Anesthesia Preprocedure Evaluation (Signed)
Anesthesia Evaluation  Patient identified by MRN, date of birth, ID band Patient awake    Reviewed: Allergy & Precautions, NPO status , Patient's Chart, lab work & pertinent test results  History of Anesthesia Complications Negative for: history of anesthetic complications  Airway Mallampati: III  TM Distance: <3 FB Neck ROM: full    Dental  (+) Chipped   Pulmonary neg shortness of breath, sleep apnea , former smoker,    Pulmonary exam normal        Cardiovascular Exercise Tolerance: Good (-) angina(-) Past MI negative cardio ROS Normal cardiovascular exam     Neuro/Psych PSYCHIATRIC DISORDERS negative neurological ROS     GI/Hepatic Neg liver ROS, hiatal hernia, GERD  Medicated and Controlled,  Endo/Other  diabetes, Type 2  Renal/GU      Musculoskeletal  (+) Arthritis ,   Abdominal   Peds  Hematology negative hematology ROS (+)   Anesthesia Other Findings Past Medical History: No date: Anxiety No date: Arthritis No date: Cough No date: Depression No date: Diabetes mellitus without complication (HCC) No date: Dyspnea No date: GERD (gastroesophageal reflux disease) No date: History of hiatal hernia No date: History of kidney stones  Past Surgical History: No date: CESAREAN SECTION     Comment:  x5 1994,1995,1996,1998,2002 2004: LAPAROSCOPIC ASSISTED VAGINAL HYSTERECTOMY     Comment:  left ovary removed No date: LITHOTRIPSY     Comment:  multiple No date: TONSILLECTOMY     Comment:  as a toddler  BMI    Body Mass Index: 37.86 kg/m      Reproductive/Obstetrics negative OB ROS                             Anesthesia Physical Anesthesia Plan  ASA: 3  Anesthesia Plan: Spinal   Post-op Pain Management:    Induction:   PONV Risk Score and Plan:   Airway Management Planned: Natural Airway and Nasal Cannula  Additional Equipment:   Intra-op Plan:   Post-operative  Plan:   Informed Consent: I have reviewed the patients History and Physical, chart, labs and discussed the procedure including the risks, benefits and alternatives for the proposed anesthesia with the patient or authorized representative who has indicated his/her understanding and acceptance.     Dental Advisory Given  Plan Discussed with: Anesthesiologist, CRNA and Surgeon  Anesthesia Plan Comments: (Patient reports no bleeding problems and no anticoagulant use.  Plan for spinal with backup GA  Patient consented for risks of anesthesia including but not limited to:  - adverse reactions to medications - damage to eyes, teeth, lips or other oral mucosa - nerve damage due to positioning  - risk of bleeding, infection and or nerve damage from spinal that could lead to paralysis - risk of headache or failed spinal - damage to teeth, lips or other oral mucosa - sore throat or hoarseness - damage to heart, brain, nerves, lungs, other parts of body or loss of life  Patient voiced understanding.)        Anesthesia Quick Evaluation

## 2020-11-20 NOTE — H&P (Addendum)
PREOPERATIVE H&P  Chief Complaint: Left Knee Osteoarthritis  HPI: Kristi Mccoy is a 55 y.o. female who presents for preoperative history and physical with a diagnosis of Left Knee Osteoarthritis. Symptoms of pain with weightbearing, swelling and immobility are significantly impairing activities of daily living.  Patient's x-rays show findings of joint space narrowing, subchondral sclerosis and osteophyte formation.  The patient has failed nonoperative management wished to proceed with a left total knee arthroplasty.  Past Medical History:  Diagnosis Date   Anxiety    Arthritis    Cough    Depression    Diabetes mellitus without complication (HCC)    Dyspnea    GERD (gastroesophageal reflux disease)    History of hiatal hernia    History of kidney stones    Past Surgical History:  Procedure Laterality Date   CESAREAN SECTION     x5 1994,1995,1996,1998,2002   LAPAROSCOPIC ASSISTED VAGINAL HYSTERECTOMY  2004   left ovary removed   LITHOTRIPSY     multiple   TONSILLECTOMY     as a toddler   Social History   Socioeconomic History   Marital status: Married    Spouse name: Saralyn Pilar   Number of children: 5   Years of education: Not on file   Highest education level: Not on file  Occupational History   Not on file  Tobacco Use   Smoking status: Former    Types: Cigarettes   Smokeless tobacco: Never  Vaping Use   Vaping Use: Never used  Substance and Sexual Activity   Alcohol use: No   Drug use: Not Currently    Comment: in her 20's   Sexual activity: Not on file  Other Topics Concern   Not on file  Social History Narrative   Not on file   Social Determinants of Health   Financial Resource Strain: Not on file  Food Insecurity: Not on file  Transportation Needs: Not on file  Physical Activity: Not on file  Stress: Not on file  Social Connections: Not on file   Family History  Problem Relation Age of Onset   Diabetes Mother    Diabetes Father    Diabetes Sister     Allergies  Allergen Reactions   Azithromycin Swelling    Painful, swollen joints   Prior to Admission medications   Medication Sig Start Date End Date Taking? Authorizing Provider  clonazePAM (KLONOPIN) 1 MG tablet Take 1-2 mg by mouth 3 (three) times daily as needed for anxiety.  08/31/06  Yes [provider]  Empagliflozin-Linaglip-Metform (TRIJARDY XR) 5-2.07-998 MG TB24 Take 1 tablet by mouth daily with breakfast.   Yes [provider]  ibuprofen (ADVIL) 200 MG tablet Take 800 mg by mouth every 6 (six) hours as needed for moderate pain.   Yes [provider]  omeprazole (PRILOSEC) 20 MG capsule Take 20 mg by mouth daily with supper. 10/15/20  Yes [provider]  ondansetron (ZOFRAN ODT) 4 MG disintegrating tablet Take 1 tablet (4 mg total) by mouth every 8 (eight) hours as needed for nausea or vomiting. 11/07/20  Yes Apolonio Schneiders, FNP  sertraline (ZOLOFT) 100 MG tablet Take 100 mg by mouth daily. 01/21/07  Yes [provider]  zolpidem (AMBIEN) 10 MG tablet Take 5-10 mg by mouth at bedtime. 05/01/11  Yes [provider]     Positive ROS: All other systems have been reviewed and were otherwise negative with the exception of those mentioned in the HPI and as above.  Physical  Exam: General: Alert, no acute distress Cardiovascular: Regular rate and rhythm, no murmurs rubs or gallops.  No pedal edema Respiratory: Clear to auscultation bilaterally, no wheezes rales or rhonchi. No cyanosis, no use of accessory musculature GI: No organomegaly, abdomen is soft and non-tender nondistended with positive bowel sounds. Skin: Skin intact, no lesions within the operative field. Neurologic: Sensation intact distally Psychiatric: Patient is competent for consent with normal mood and affect Lymphatic: No cervical lymphadenopathy  MUSCULOSKELETAL: Left knee: Patient skin is intact.  There is no erythema ecchymosis or significant effusion.  Range of  motion is from 0 to 115 degrees.  Patient has no instability.  Patient has no calf tenderness or lower leg edema.  She has palpable pedal pulses, intact sensation light touch intact motor function.  Assessment: Left Knee Osteoarthritis  Plan: Plan for Procedure(s): LEFT TOTAL KNEE ARTHROPLASTY  I met with the patient preoperative area.  Preop history and physical was performed.  I reviewed the patient's labs and x-rays in preparation for this case.  I marked the left knee according the hospital's correct site of surgery protocol after verbally confirming with the patient that this was the correct site of surgery.  Reviewed the details of the operation as well as the postoperative course.  I discussed the risks and benefits of surgery. The risks include but are not limited to infection, bleeding requiring blood transfusion, nerve or blood vessel injury, joint stiffness or loss of motion, persistent pain, weakness or instability, loosening or hardware failure and the need for further surgery including revision left total knee arthroplasty. Medical risks include but are not limited to DVT and pulmonary embolism, myocardial infarction, stroke, pneumonia, respiratory failure and death. Patient understood these risks and wished to proceed.   The patient expressed concerns about going home postoperatively.  I will consult social work immediately postop to help to address the patient's concerns and begin formulating a safe discharge plan.    Thornton Park, MD   11/20/2020 7:53 AM

## 2020-11-20 NOTE — Op Note (Signed)
DATE OF SURGERY:  11/20/2020 TIME: 11:08 AM  PATIENT NAME:  Kristi Mccoy   AGE: 55 y.o.    PRE-OPERATIVE DIAGNOSIS:  Left Knee Osteoarthritis  POST-OPERATIVE DIAGNOSIS:  Same  PROCEDURE:  Procedure(s): LEFT TOTAL KNEE ARTHROPLASTY  SURGEON:  Juanell Fairly, MD   ASSISTANT:  Cam Hai, PA  OPERATIVE IMPLANTS: Depuy PFC Sigma, Posterior Stabilized Femural component size 2, Tibia size rotating platform component size 2.5, Patella polyethylene 3-peg oval button size 35, with a 12.5 mm polyethylene insert.  EBL:  50 cc  TOURNIQUET TIME: 104 minutes  PREOPERATIVE INDICATIONS:  Kristi Mccoy is an 55 y.o. female who has a diagnosis of  Left Knee Osteoarthritis and elected for a left total knee arthroplasty after failing nonoperative treatment.  Their knee pain significantly impacts their activity of daily living.  Radiographs have demonstrated tricompartmental osteoarthritis joint space narrowing, osteophytes and subchondral sclerosis.  The risks, benefits, and alternatives were discussed at length including but not limited to the risks of infection, bleeding, nerve or blood vessel injury, knee stiffness, fracture, dislocation, loosening or failure of the hardware and the need for further surgery. Medical risks include but not limited to DVT and pulmonary embolism, myocardial infarction, stroke, pneumonia, respiratory failure and death. I discussed these risks with the patient in my office prior to the date of surgery. They understood these risks and were willing to proceed.  OPERATIVE FINDINGS AND UNIQUE ASPECTS OF THE CASE: Advanced tricompartmental osteoarthritis  OPERATIVE DESCRIPTION:  The patient was brought to the operative room and placed in a supine position after undergoing placement of a spinal anesthetic.  A Foley catheter was placed.  IV antibiotics were given. Patient received Ancef 2 g IV and clindamycin IV.  Patient also received an intravenous dose of tranexamic acid prior to  inflation of the tourniquet.  The lower extremity was prepped and draped in the usual sterile fashion.  A time out was performed to verify the patient's name, date of birth, medical record number, correct site of surgery and correct procedure to be performed. The timeout was also used to confirm the patient received antibiotics and that appropriate instruments, implants and radiographs studies were available in the room.  The leg was elevated and exsanguinated with an Esmarch and the tourniquet was inflated to 275 mmHg for 104 minutes..  A midline incision was made over the left knee. Full-thickness skin flaps were developed. A medial parapatellar arthrotomy was then made and the patella everted and the knee was brought into 90 of flexion. Hoffa's fat pad along with the cruciate ligaments and medial and lateral menisci were resected.   The distal femoral intramedullary canal was opened with a drill and the intramedullary distal femoral cutting jig was inserted into the femoral canal pinned into position. It was set at 5 degrees resecting 10 mm off the distal femur.  Care was taken to protect the collateral ligaments during distal femoral resection.  The distal femoral resection was performed with an oscillating saw. The femoral cutting guide was then removed.  The extramedullary tibial cutting guide was then placed using the anterior tibial crest and second ray of the foot as a references.  The tibial cutting guide was adjusted to allow for appropriate posterior slope.  The tibial cutting block was pinned into position. The slotted stylus was used to measure the proximal tibial resection of 8 mm off the high lateral side.  The tibial long rod alignment guide was then used to confirm position of the cutting block. A  third cross pin through the tibial cutting block was then drilled into position to allow for rotational stability. Care was taken during the tibial resection to protect the medial and collateral  ligaments.  The resected tibial bone was removed along with the posterior horns of the menisci.  The PCL was sacrificed.  Extension gap was measured with a spacer block and alignment and extension was confirmed using a long alignment rod.  The attention was then turned back to the femur. The posterior referencing distal femoral sizing guide was applied to the distal femur.  The femur was sized to be a size 2. Rotation of the referencing guide was checked with the epicondylar axis and Whitesides line. Then the 4-in-1 cutting jig was then applied to the distal femur. A stylus was used to confirm that the anterior femur would not be notched.   Then the anterior, posterior and chamfer femoral cuts were then made with an oscillating saw.  The flexion gap was then measured with a flexion spacer block and long alignment rod and was found to be symmetric with the extension gap and perpendicular to mechanical axis of the tibia.  The distal femoral preparation was completed by performing the posterior stabilized box cut using the cutting block. The entry site for the intramedullary femoral guide was filled with autologous bone graft from bone previously resected earlier in the case.  The proximal tibia plateau was then sized with trial trays. The best coverage was achieved with a size 2.5. This tibial tray was then pinned into position. The proximal tibia was then prepared with the reamer and keel punch.  After tibial preparation was completed, all trial components were inserted with polyethylene trials.  The knee was found to have excellent balance and full motion with a size 10 mm tibial polyethylene insert..    The attention was then turned to preparation of the patella. The thickness of the patella was measured with a caliper, the diameter measured with the patella templates.  The patella resection was then made with an oscillating saw using the patella cutting guide.   3 peg holes for the patella component were  then drilled. The trial patella was then placed. Knee was taken through a full range of motion and deemed to be stable with the trial components. All trial components were then removed. The knee capsule was then injected with Exparel.  The knee joint capsule was injected with a mixture of quarter percent Marcaine, Toradol and morphine to assist with postoperative pain relief.  The joint was copiously irrigated with pulse lavage.  The final total knee arthroplasty components were then cemented into place with a 10 mm trial polyethylene insert and all excess methylmethacrylate was removed.  The joint was again copiously irrigated. After the cement had hardened the knee was again taken through a full range of motion. It was felt to be most stable with the 12.5 mm tibial polyethylene insert. The actual tibial polyethylene insert was then placed.   The knee was taken through a range of motion and the patella tracked well and the knee was again irrigated copiously.    The medial arthrotomy was closed with #1 Ethibond. The subcutaneous tissue closed with 0 and 2-0 vicryl, and skin approximated with staples.  A dry sterile and compressive dressing was applied.  A Polar Care was applied to the operative knee along with a knee immobilizer.  The patient was awakened and brought to the PACU in stable and satisfactory condition.  All sharp,  lap and instrument counts were correct at the conclusion the case.

## 2020-11-20 NOTE — Anesthesia Procedure Notes (Signed)
Spinal  Patient location during procedure: OR Start time: 11/20/2020 7:59 AM End time: 11/20/2020 8:00 AM Reason for block: surgical anesthesia Staffing Performed: anesthesiologist  Anesthesiologist: Josely Moffat, Precious Haws, MD Preanesthetic Checklist Completed: patient identified, IV checked, site marked, risks and benefits discussed, surgical consent, monitors and equipment checked, pre-op evaluation and timeout performed Spinal Block Patient position: sitting Prep: ChloraPrep Patient monitoring: heart rate, continuous pulse ox, blood pressure and cardiac monitor Approach: midline Location: L3-4 Injection technique: single-shot Needle Needle type: Whitacre and Introducer  Needle gauge: 24 G Needle length: 9 cm Assessment Sensory level: T10 Events: CSF return Additional Notes Negative paresthesia. Negative blood return. Positive free-flowing CSF. Expiration date of kit checked and confirmed. Patient tolerated procedure well, without complications.

## 2020-11-20 NOTE — Evaluation (Addendum)
Physical Therapy Evaluation Patient Details Name: Kristi Mccoy MRN: [id number] DOB: 1965-04-15 Today's Date: 11/20/2020  History of Present Illness  Kristi Mccoy is a 55yoF who comes to [facility] on 11/20/20 for elective Left TKA.  Clinical Impression  Pt admitted with above diagnosis. Pt currently with functional limitations due to the deficits listed below (see "PT Problem List"). Upon entry, pt in bed, awake and agreeable to participate- RN and surgeon at bedside. Pt attests to full sensation return. Per RN pain meds received prior to entry.  The pt is alert, pleasant, interactive, and able to provide info regarding prior level of function, both in tolerance and independence. Pt is somewhat anxious about her lack of support upon return to home, desires ability to return to independence prior to return to home. Educated patient on weight-bearing restrictions, safety precautions, use of brace and polar care, home exercises. Pt does well with HEP education, requires some physical assistance, but is limited by stiffness and neuromotor inhibition moreso than pain, albeit WNL.   About midway through session, pt becomes suddenly distracted and wide-eyed, verbalizes sudden sensation of feeling quite strange, lightheaded, nausea worse, hot and flush, asking for wet wash cloth for her head. Upon transition to EOB, pt becomes intoxicated in appearance, impulsively attempts to lie down with head near foot of bed, then asks to return to supine for fear of passing out. Pt assisted back to supine, eventually captured a BP while HOB at 30 degrees (~90 seconds later), noted BP drop from earlier, then worse when taken again with HOB at 45 degrees. Pt returned to a lower position, with feet elevated, towel under left ankle, ice reapplied. RN made aware. BP stable.   Patient's performance this date reveals decreased ability, independence, and tolerance in performing all basic mobility required for performance of activities of daily  living. Pt requires additional DME, close physical assistance, and cues for safe participate in mobility. Pt will benefit from skilled PT intervention to increase independence and safety with basic mobility in preparation for discharge to the venue listed below.     Orthostatic VS for the past 24 hrs (Last 3 readings):  BP- Lying Pulse- Lying BP- Sitting Pulse- Sitting  11/20/20 1456 108/69 64 90/71 62  *of note: Supine BP in PACU at 12:48 148/86 mmHg         Recommendations for follow up therapy are one component of a multi-disciplinary discharge planning process, led by the attending physician.  Recommendations may be updated based on patient status, additional functional criteria and insurance authorization.  Follow Up Recommendations SNF;Supervision for mobility/OOB    Equipment Recommendations  Rolling walker with 5" wheels;3in1 (PT)    Recommendations for Other Services       Precautions / Restrictions Precautions Precautions: Fall;Knee Precaution Booklet Issued: Yes (comment) Required Braces or Orthoses: Knee Immobilizer - Left Knee Immobilizer - Left: Other (comment) (to promote TKE when in bed, can remove for PT and mobility) Restrictions Weight Bearing Restrictions: Yes LLE Weight Bearing: Weight bearing as tolerated      Mobility  Bed Mobility Overal bed mobility: Needs Assistance Bed Mobility: Sit to Supine;Supine to Sit     Supine to sit: Supervision Sit to supine: Max assist (becomes presyncopal and requires extensive physical assist to return to supine)        Transfers Overall transfer level:  (deferred due to hypotension and presyncope)                  Ambulation/Gait  Stairs            Wheelchair Mobility    Modified Rankin (Stroke Patients Only)       Balance                                             Pertinent Vitals/Pain Pain Assessment: 0-10 Pain Score: 6  Pain Location:  Operative knee Pain Descriptors / Indicators: Aching Pain Intervention(s): Limited activity within patient's tolerance;Monitored during session;Premedicated before session;Repositioned;Ice applied    Home Living Family/patient expects to be discharged to:: Skilled nursing facility Living Arrangements: Spouse/significant other   Type of Home: House Home Access: Stairs to enter   Secretary/administrator of Steps: 6 Home Layout: Two level Home Equipment: None      Prior Function Level of Independence: Independent with assistive device(s)   Gait / Transfers Assistance Needed: Mostly bedbound with significant disability, but still trying to get limited grocery trips  ADL's / Homemaking Assistance Needed: modI        Hand Dominance        Extremity/Trunk Assessment   Upper Extremity Assessment Upper Extremity Assessment: Overall WFL for tasks assessed    Lower Extremity Assessment Lower Extremity Assessment: Overall WFL for tasks assessed;Generalized weakness       Communication      Cognition Arousal/Alertness: Lethargic;Suspect due to medications Behavior During Therapy: [facility] for tasks assessed/performed Overall Cognitive Status: Within Functional Limits for tasks assessed                                        General Comments      Exercises Total Joint Exercises Ankle Circles/Pumps: AROM;10 reps;Supine Quad Sets: AROM;Left;10 reps;Supine Short Arc Quad: AAROM;Left;10 reps;Supine Heel Slides: AAROM;Left;10 reps;Supine Hip ABduction/ADduction: AAROM;Left;10 reps;Supine Goniometric ROM: 23-63 degrees Left knee ROM (more at EOB, but unable to assess due to rapid presyncope)   Assessment/Plan    PT Assessment Patient needs continued PT services  PT Problem List Decreased strength;Decreased range of motion;Decreased activity tolerance;Decreased balance;Decreased mobility;Decreased knowledge of precautions       PT Treatment Interventions DME  instruction;Balance training;Gait training;Stair training;Functional mobility training;Therapeutic activities;Therapeutic exercise;Patient/family education    PT Goals (Current goals can be found in the Care Plan section)  Acute Rehab PT Goals Patient Stated Goal: be able to AMB a flight of stair before she returns to home PT Goal Formulation: With patient Time For Goal Achievement: 12/04/20 Potential to Achieve Goals: Good    Frequency BID   Barriers to discharge Decreased caregiver support;Inaccessible home environment husband is not a reliable support, BR/BR are on second floor    Co-evaluation               AM-PAC PT "6 Clicks" Mobility  Outcome Measure Help needed turning from your back to your side while in a flat bed without using bedrails?: A Lot Help needed moving from lying on your back to sitting on the side of a flat bed without using bedrails?: A Lot Help needed moving to and from a bed to a chair (including a wheelchair)?: Total Help needed standing up from a chair using your arms (e.g., wheelchair or bedside chair)?: Total Help needed to walk in hospital room?: Total Help needed climbing 3-5 steps with a railing? : Total 6  Click Score: 8    End of Session   Activity Tolerance: No increased pain;Treatment limited secondary to medical complications (Comment);Patient tolerated treatment well (presyncope at EOB, nauseated, session ended at this point) Patient left: in bed;with call Genna/phone within reach;with bed alarm set;with SCD's reapplied Nurse Communication: Mobility status PT Visit Diagnosis: Difficulty in walking, not elsewhere classified (R26.2);Muscle weakness (generalized) (M62.81);Dizziness and giddiness (R42);Other symptoms and signs involving the nervous system (R29.898)    Time: 3428-7681 PT Time Calculation (min) (ACUTE ONLY): 57 min   Charges:   PT Evaluation $PT Eval Low Complexity: 1 Low PT Treatments $Therapeutic Exercise: 23-37  mins $Self Care/Home Management: 8-22       3:55 PM, 11/20/20 Rosamaria Lints, PT, DPT Physical Therapist - [facility]  [phone] (ASCOM)    Kristi Mccoy 11/20/2020, 3:46 PM

## 2020-11-20 NOTE — Transfer of Care (Signed)
Immediate Anesthesia Transfer of Care Note  Patient: Kristi Mccoy  Procedure(s) Performed: LEFT TOTAL KNEE ARTHROPLASTY (Left: Knee)  Patient Location: PACU  Anesthesia Type:General  Level of Consciousness: awake  Airway & Oxygen Therapy: Patient Spontanous Breathing  Post-op Assessment: Report given to RN and Post -op Vital signs reviewed and stable  Post vital signs: Reviewed and stable  Last Vitals:  Vitals Value Taken Time  BP 123/78 11/20/20 1109  Temp 36.4 C 11/20/20 1110  Pulse 77 11/20/20 1111  Resp 19 11/20/20 1111  SpO2 97 % 11/20/20 1111  Vitals shown include unvalidated device data.  Last Pain:  Vitals:   11/20/20 0621  TempSrc: Oral  PainSc: 5          Complications: No notable events documented.

## 2020-11-21 DIAGNOSIS — G4730 Sleep apnea, unspecified: Secondary | ICD-10-CM | POA: Diagnosis present

## 2020-11-21 DIAGNOSIS — Z87442 Personal history of urinary calculi: Secondary | ICD-10-CM | POA: Diagnosis not present

## 2020-11-21 DIAGNOSIS — Z833 Family history of diabetes mellitus: Secondary | ICD-10-CM | POA: Diagnosis not present

## 2020-11-21 DIAGNOSIS — K219 Gastro-esophageal reflux disease without esophagitis: Secondary | ICD-10-CM | POA: Diagnosis present

## 2020-11-21 DIAGNOSIS — Z90710 Acquired absence of both cervix and uterus: Secondary | ICD-10-CM | POA: Diagnosis not present

## 2020-11-21 DIAGNOSIS — Z8616 Personal history of COVID-19: Secondary | ICD-10-CM | POA: Diagnosis not present

## 2020-11-21 DIAGNOSIS — Z881 Allergy status to other antibiotic agents status: Secondary | ICD-10-CM | POA: Diagnosis not present

## 2020-11-21 DIAGNOSIS — R1012 Left upper quadrant pain: Secondary | ICD-10-CM | POA: Diagnosis not present

## 2020-11-21 DIAGNOSIS — I951 Orthostatic hypotension: Secondary | ICD-10-CM | POA: Diagnosis not present

## 2020-11-21 DIAGNOSIS — Z20822 Contact with and (suspected) exposure to covid-19: Secondary | ICD-10-CM | POA: Diagnosis present

## 2020-11-21 DIAGNOSIS — E119 Type 2 diabetes mellitus without complications: Secondary | ICD-10-CM | POA: Diagnosis present

## 2020-11-21 DIAGNOSIS — M1712 Unilateral primary osteoarthritis, left knee: Secondary | ICD-10-CM | POA: Diagnosis present

## 2020-11-21 DIAGNOSIS — F32A Depression, unspecified: Secondary | ICD-10-CM | POA: Diagnosis present

## 2020-11-21 DIAGNOSIS — Z79899 Other long term (current) drug therapy: Secondary | ICD-10-CM | POA: Diagnosis not present

## 2020-11-21 DIAGNOSIS — Z7984 Long term (current) use of oral hypoglycemic drugs: Secondary | ICD-10-CM | POA: Diagnosis not present

## 2020-11-21 DIAGNOSIS — Z87891 Personal history of nicotine dependence: Secondary | ICD-10-CM | POA: Diagnosis not present

## 2020-11-21 DIAGNOSIS — F419 Anxiety disorder, unspecified: Secondary | ICD-10-CM | POA: Diagnosis present

## 2020-11-21 DIAGNOSIS — Z90721 Acquired absence of ovaries, unilateral: Secondary | ICD-10-CM | POA: Diagnosis not present

## 2020-11-21 DIAGNOSIS — R110 Nausea: Secondary | ICD-10-CM | POA: Diagnosis not present

## 2020-11-21 LAB — BASIC METABOLIC PANEL
Anion gap: 5 (ref 5–15)
BUN: 12 mg/dL (ref 6–20)
CO2: 26 mmol/L (ref 22–32)
Calcium: 8.8 mg/dL — ABNORMAL LOW (ref 8.9–10.3)
Chloride: 106 mmol/L (ref 98–111)
Creatinine, Ser: 0.53 mg/dL (ref 0.44–1.00)
GFR, Estimated: >60 mL/min (ref >60)
Glucose, Bld: 160 mg/dL — ABNORMAL HIGH (ref 70–99)
Potassium: 4 mmol/L (ref 3.5–5.1)
Sodium: 137 mmol/L (ref 135–145)

## 2020-11-21 LAB — GLUCOSE, CAPILLARY
Glucose-Capillary: 163 mg/dL — ABNORMAL HIGH (ref 70–99)
Glucose-Capillary: 138 mg/dL — ABNORMAL HIGH (ref 70–99)
Glucose-Capillary: 100 mg/dL — ABNORMAL HIGH (ref 70–99)
Glucose-Capillary: 136 mg/dL — ABNORMAL HIGH (ref 70–99)

## 2020-11-21 LAB — CBC
HCT: 36.2 % (ref 36.0–46.0)
Hemoglobin: 11.8 g/dL — ABNORMAL LOW (ref 12.0–15.0)
MCH: 27.4 pg (ref 26.0–34.0)
MCHC: 32.6 g/dL (ref 30.0–36.0)
MCV: 84.2 fL (ref 80.0–100.0)
Platelets: 238 10*3/uL (ref 150–400)
RBC: 4.3 MIL/uL (ref 3.87–5.11)
RDW: 14.1 % (ref 11.5–15.5)
WBC: 10.6 10*3/uL — ABNORMAL HIGH (ref 4.0–10.5)
nRBC: 0 % (ref 0.0–0.2)

## 2020-11-21 NOTE — Anesthesia Postprocedure Evaluation (Signed)
Anesthesia Post Note  Patient: Kristi Mccoy  Procedure(s) Performed: LEFT TOTAL KNEE ARTHROPLASTY (Left: Knee)  Patient location during evaluation: Nursing Unit Anesthesia Type: Spinal Level of consciousness: oriented and awake and alert Pain management: pain level not controlled Vital Signs Assessment: post-procedure vital signs reviewed and stable Respiratory status: spontaneous breathing and respiratory function stable Cardiovascular status: blood pressure returned to baseline and stable Postop Assessment: no headache, no backache, no apparent nausea or vomiting and patient able to bend at knees Anesthetic complications: no Comments: Patient requesting more pain medication, request relayed to inpatient RN.   No notable events documented.   Last Vitals:  Vitals:   11/20/20 2002 11/21/20 0719  BP: 101/61 (!) 159/83  Pulse: 63 80  Resp: 19 15  Temp: 36.7 C 36.7 C  SpO2: 98% 94%    Last Pain:  Vitals:   11/21/20 0848  TempSrc:   PainSc: 5                  Lynden Oxford

## 2020-11-21 NOTE — Progress Notes (Signed)
Subjective:  POD #1 s/p left TKA.   Patient reports left knee pain as marked.  Patient was able to OOB to a chair earlier today.   Patient seen with her RN, Peyton Najjar at the bedside.  Patient experiencing nausea as well.    Objective:   VITALS:   Vitals:   11/20/20 1506 11/20/20 2002 11/21/20 0719 11/21/20 0900  BP: 114/77 101/61 (!) 159/83   Pulse: 66 63 80   Resp:  19 15   Temp:  98.1 F (36.7 C) 98.1 F (36.7 C)   TempSrc:      SpO2:  98% 94% 96%  Weight:      Height:        PHYSICAL EXAM: Left lower extremity: I personally changed the patient's dressing today with RN help.  Patient's aquacel has minimal drainage.    Neurovascular intact Sensation intact distally Intact pulses distally Dorsiflexion/Plantar flexion intact No cellulitis present Compartment soft  LABS  Results for orders placed or performed during the hospital encounter of 11/20/20 (from the past 24 hour(s))  Glucose, capillary     Status: Abnormal   Collection Time: 11/20/20  4:39 PM  Result Value Ref Range   Glucose-Capillary 113 (H) 70 - 99 mg/dL  Glucose, capillary     Status: Abnormal   Collection Time: 11/20/20  8:06 PM  Result Value Ref Range   Glucose-Capillary 182 (H) 70 - 99 mg/dL   Comment 1 Notify RN    Comment 2 Document in Chart   CBC     Status: Abnormal   Collection Time: 11/21/20  5:22 AM  Result Value Ref Range   WBC 10.6 (H) 4.0 - 10.5 K/uL   RBC 4.30 3.87 - 5.11 MIL/uL   Hemoglobin 11.8 (L) 12.0 - 15.0 g/dL   HCT 24.0 97.3 - 53.2 %   MCV 84.2 80.0 - 100.0 fL   MCH 27.4 26.0 - 34.0 pg   MCHC 32.6 30.0 - 36.0 g/dL   RDW 99.2 42.6 - 83.4 %   Platelets 238 150 - 400 K/uL   nRBC 0.0 0.0 - 0.2 %  Basic metabolic panel     Status: Abnormal   Collection Time: 11/21/20  5:22 AM  Result Value Ref Range   Sodium 137 135 - 145 mmol/L   Potassium 4.0 3.5 - 5.1 mmol/L   Chloride 106 98 - 111 mmol/L   CO2 26 22 - 32 mmol/L   Glucose, Bld 160 (H) 70 - 99 mg/dL   BUN 12 6 - 20 mg/dL    Creatinine, Ser 1.96 0.44 - 1.00 mg/dL   Calcium 8.8 (L) 8.9 - 10.3 mg/dL   GFR, Estimated >22 >29 mL/min   Anion gap 5 5 - 15  Glucose, capillary     Status: Abnormal   Collection Time: 11/21/20  7:39 AM  Result Value Ref Range   Glucose-Capillary 163 (H) 70 - 99 mg/dL  Glucose, capillary     Status: Abnormal   Collection Time: 11/21/20 11:12 AM  Result Value Ref Range   Glucose-Capillary 138 (H) 70 - 99 mg/dL    DG Knee Left Port  Result Date: 11/20/2020 CLINICAL DATA:  Postop knee replacement EXAM: PORTABLE LEFT KNEE - 1-2 VIEW COMPARISON:  None. FINDINGS: Two views study shows tricompartmental knee replacement. Gas in the soft tissues is compatible with the surgery. No evidence for immediate hardware complication. IMPRESSION: Status post tricompartmental knee replacement. No evidence for immediate hardware complication. Electronically Signed   By: Minerva Areola  Molli Posey M.D.   On: 11/20/2020 11:59    Assessment/Plan: 1 Day Post-Op   Active Problems:   S/P TKR (total knee replacement) using cement, left   Patient with post-op pain.   The interval of her oxycodone will be shortened to allow more frequent dosing.  Continue PT.  Foley catheter discontinued.  Patient is being recommended for SNF.  Patient will be changed to inpatient due to her orthostatic hypotension and IVF requirement as well as current issues with pain management.     Juanell Fairly , MD 11/21/2020, 2:12 PM

## 2020-11-21 NOTE — Plan of Care (Signed)
Patient alert and oriented x 4, complains of pain to surgical site, relieved with prn and scheduled pain medications. Dressing remains clean, dry and intact, polar care to site. Verbalized nausea relief with anti-emetics. Vitals stable, no respiratory distress on room air. Educated on bowel regimen post op day one, foley removed at 0500. Will continue to monitor.  Problem: Education: Goal: Knowledge of General Education information will improve Description: Including pain rating scale, medication(s)/side effects and non-pharmacologic comfort measures Outcome: Progressing   Problem: Health Behavior/Discharge Planning: Goal: Ability to manage health-related needs will improve Outcome: Progressing   Problem: Clinical Measurements: Goal: Ability to maintain clinical measurements within normal limits will improve Outcome: Progressing Goal: Will remain free from infection Outcome: Progressing Goal: Diagnostic test results will improve Outcome: Progressing Goal: Respiratory complications will improve Outcome: Progressing Goal: Cardiovascular complication will be avoided Outcome: Progressing

## 2020-11-21 NOTE — NC FL2 (Signed)
[place] MEDICAID FL2 LEVEL OF CARE SCREENING TOOL     IDENTIFICATION  Patient Name: Kristi Mccoy Birthdate: 1966/01/30 Sex: female Admission Date (Current Location): 11/20/2020  [facility] and [place] Number:  Chiropodist and Address:  [facility], [address], [place], [place] [address]      Provider Number: [id number]  Attending Physician Name and Address:  Juanell Fairly, MD  Relative Name and Phone Number:  Luisa Hart [phone]    Current Level of Care: Hospital Recommended Level of Care: Skilled Nursing Facility Prior Approval Number:    Date Approved/Denied:   PASRR Number: [id number]  Discharge Plan: SNF    Current Diagnoses: Patient Active Problem List   Diagnosis Date Noted   S/P TKR (total knee replacement) using cement, left 11/20/2020    Orientation RESPIRATION BLADDER Height & Weight     Self, Time, Situation, Place  Normal Continent, External catheter Weight: 93.9 kg Height:  5\' 2"  (157.5 cm)  BEHAVIORAL SYMPTOMS/MOOD NEUROLOGICAL BOWEL NUTRITION STATUS      Continent Diet (carb modified)  AMBULATORY STATUS COMMUNICATION OF NEEDS Skin   Extensive Assist Verbally Normal, Surgical wounds                       Personal Care Assistance Level of Assistance  Bathing, Dressing Bathing Assistance: Limited assistance   Dressing Assistance: Limited assistance     Functional Limitations Info             SPECIAL CARE FACTORS FREQUENCY  PT (By licensed PT), OT (By licensed OT)     PT Frequency: 5 times per week OT Frequency: 5 times per week            Contractures Contractures Info: Not present    Additional Factors Info  Code Status, Allergies Code Status Info: Full code Allergies Info: Azithromycin           Current Medications (11/21/2020):  This is the current hospital active medication list Current Facility-Administered Medications  Medication Dose Route Frequency Provider Last Rate  Last Admin   0.9 %  sodium chloride infusion   Intravenous Continuous 11/23/2020, MD 0 mL/hr at 11/20/20 2216 Restarted at 11/20/20 2216   acetaminophen (TYLENOL) tablet 325-650 mg  325-650 mg Oral Q6H PRN 2217, MD       bisacodyl (DULCOLAX) suppository 10 mg  10 mg Rectal Daily PRN Juanell Fairly, MD       clonazePAM Juanell Fairly) tablet 1-2 mg  1-2 mg Oral TID PRN Scarlette Calico, MD       linagliptin (TRADJENTA) tablet 2.5 mg  2.5 mg Oral Daily Juanell Fairly, RPH       And   metFORMIN (GLUCOPHAGE-XR) 24 hr tablet 1,000 mg  1,000 mg Oral Q breakfast Sharen Hones, RPH   1,000 mg at 11/21/20 11/23/20   And   dapagliflozin propanediol (FARXIGA) tablet 5 mg  5 mg Oral Daily 2703, RPH       diphenhydrAMINE (BENADRYL) 12.5 MG/5ML elixir 12.5-25 mg  12.5-25 mg Oral Q4H PRN 04-09-1984, MD       docusate sodium (COLACE) capsule 100 mg  100 mg Oral BID Juanell Fairly, MD   100 mg at 11/20/20 2212   enoxaparin (LOVENOX) injection 40 mg  40 mg Subcutaneous Q24H 2213, MD   40 mg at 11/21/20 0803   HYDROmorphone (DILAUDID) injection 0.5-1 mg  0.5-1 mg Intravenous Q4H PRN 11/23/20, MD   1  mg at 11/20/20 2325   insulin aspart (novoLOG) injection 0-15 Units  0-15 Units Subcutaneous TID WC Juanell Fairly, MD   3 Units at 11/21/20 0804   insulin aspart (novoLOG) injection 0-5 Units  0-5 Units Subcutaneous QHS Juanell Fairly, MD       methocarbamol (ROBAXIN) tablet 500 mg  500 mg Oral Q6H PRN Juanell Fairly, MD   500 mg at 11/20/20 1224   Or   methocarbamol (ROBAXIN) 500 mg in dextrose 5 % 50 mL IVPB  500 mg Intravenous Q6H PRN Juanell Fairly, MD       metoCLOPramide (REGLAN) tablet 5-10 mg  5-10 mg Oral Q8H PRN Juanell Fairly, MD   10 mg at 11/20/20 1644   Or   metoCLOPramide (REGLAN) injection 5-10 mg  5-10 mg Intravenous Q8H PRN Juanell Fairly, MD       ondansetron [facility]) tablet 4 mg  4 mg Oral Q6H PRN Juanell Fairly, MD       Or    ondansetron [facility]) injection 4 mg  4 mg Intravenous Q6H PRN Juanell Fairly, MD   4 mg at 11/21/20 0630   oxyCODONE (Oxy IR/ROXICODONE) immediate release tablet 10-15 mg  10-15 mg Oral Q4H PRN Juanell Fairly, MD   15 mg at 11/21/20 8366   oxyCODONE (Oxy IR/ROXICODONE) immediate release tablet 5-10 mg  5-10 mg Oral Q4H PRN Juanell Fairly, MD       pantoprazole (PROTONIX) EC tablet 40 mg  40 mg Oral Daily Juanell Fairly, MD   40 mg at 11/20/20 1531   senna-docusate (Senokot-S) tablet 1 tablet  1 tablet Oral QHS PRN Juanell Fairly, MD       sertraline (ZOLOFT) tablet 100 mg  100 mg Oral Daily Juanell Fairly, MD   100 mg at 11/20/20 1530   sodium phosphate (FLEET) 7-19 GM/118ML enema 1 enema  1 enema Rectal Once PRN Juanell Fairly, MD       traMADol Janean Sark) tablet 50 mg  50 mg Oral Q6H Juanell Fairly, MD   50 mg at 11/21/20 2947     Discharge Medications: Please see discharge summary for a list of discharge medications.  Relevant Imaging Results:  Relevant Lab Results:   Additional Information SS# [id number],  Has had 2 covid vaccines no boosters, had covid 2 weeks ago  Barrie Dunker, RN

## 2020-11-21 NOTE — Progress Notes (Signed)
Physical Therapy Treatment Patient Details Name: Kristi Mccoy MRN: [id number] DOB: May 10, 1965 Today's Date: 11/21/2020   History of Present Illness Kristi Mccoy is a 55yoF who comes to [facility] on 11/20/20 for elective Left TKA.    PT Comments    Pt in recliner upon entry, NA at bedside, pt asking to go back to bed due to worsening flush, lightheaded, woozieness, nausea, feels generally poor. Pt assisted with BP assessment, mild drop from supine to sitting, then continued drop 5 minutes later sitting. Pt able to do some exercises in chair, then STS and SPT to bed, with subsequent BP drop again. Bed mobility much better today. Symptoms are stable, no worse with BP drops. Pt educated on KI use and application. WIll return later in day for 2nd visit.   Orthostatic VS for the past 24 hrs:  BP- Lying Pulse- Lying BP- Sitting Pulse- Sitting BP- Standing at 0 minutes Pulse- Standing at 0 minutes  11/21/20 1119 (!) 170/91 -- 159/86 -- 144/84 --  11/21/20 0900 168/85 87 152/90 82 139/79 83  11/20/20 1456 108/69 64 90/71 62 -- --        Recommendations for follow up therapy are one component of a multi-disciplinary discharge planning process, led by the attending physician.  Recommendations may be updated based on patient status, additional functional criteria and insurance authorization.  Follow Up Recommendations  SNF;Supervision for mobility/OOB     Equipment Recommendations  Rolling walker with 5" wheels;3in1 (PT)    Recommendations for Other Services       Precautions / Restrictions Precautions Precautions: Fall;Knee Precaution Booklet Issued: Yes (comment) Required Braces or Orthoses: Knee Immobilizer - Left Knee Immobilizer - Left: Other (comment) Restrictions Weight Bearing Restrictions: Yes LLE Weight Bearing: Weight bearing as tolerated     Mobility  Bed Mobility Overal bed mobility: Needs Assistance Bed Mobility: Sit to Supine     Supine to sit: Supervision Sit to supine:  Min assist   General bed mobility comments: MinA for LLE assist into bed; pt able to come to long sitting easily, can posterior scoot with mod effort.    Transfers Overall transfer level: Needs assistance Equipment used: Rolling walker (2 wheeled) Transfers: Stand Pivot Transfers;Sit to/from Stand Sit to Stand: Supervision Stand pivot transfers: Min guard       General transfer comment: low confidence in LLE weightbearing  Ambulation/Gait   Gait Distance (Feet):  (unable due to abnormal symptoms and BP drops)             Stairs             Wheelchair Mobility    Modified Rankin (Stroke Patients Only)       Balance Overall balance assessment: Needs assistance Sitting-balance support: Feet supported;Bilateral upper extremity supported;Single extremity supported Sitting balance-Leahy Scale: Good Sitting balance - Comments: Steady static sitting, reaching within BOS   Standing balance support: During functional activity;Bilateral upper extremity supported Standing balance-Leahy Scale: Fair Standing balance comment: Heavy reliance on RW for functional mobility                            Cognition Arousal/Alertness: Awake/alert Behavior During Therapy: WFL for tasks assessed/performed Overall Cognitive Status: Within Functional Limits for tasks assessed  Exercises Total Joint Exercises Long Arc Quad: Kristi Mccoy;Left;10 reps Knee Flexion: AAROM;Left;10 reps Goniometric ROM: 27-68 degrees Left knee ROM (more pain limited today) Other Exercises Other Exercises: Pt educated in falls prevention strategies, safe use of AE/DME for LB ADL management, polar care management, complementary alternative methods for pain management including gentle self-massage and distraction techniques, and routines modifications to support safety and fxl independence during meaningful occupations of daily life.    General  Comments        Pertinent Vitals/Pain Pain Assessment: 0-10 Pain Score: 6  Pain Location: Operative knee Pain Descriptors / Indicators: Aching;Guarding;Grimacing;Sore Pain Intervention(s): Limited activity within patient's tolerance;Monitored during session;Premedicated before session;Repositioned;Ice applied    Home Living Family/patient expects to be discharged to:: Skilled nursing facility Living Arrangements: Spouse/significant other   Type of Home: House Home Access: Stairs to enter   Home Layout: Two level Home Equipment: None      Prior Function Level of Independence: Independent with assistive device(s)  Gait / Transfers Assistance Needed: Pt reports she has very limited mobility at baseline 2/2 Knee pain. She states that she pushes herself to complete IADL management such as grocery shopping, yardwork, etc. because she has no other options. ADL's / Homemaking Assistance Needed: Pt independent at baseline for ADL management.     PT Goals (current goals can now be found in the care plan section) Acute Rehab PT Goals Patient Stated Goal: "To be as independent as possible, so I can get my life back." PT Goal Formulation: With patient Time For Goal Achievement: 12/04/20 Potential to Achieve Goals: Good Progress towards PT goals: Progressing toward goals    Frequency    BID      PT Plan Current plan remains appropriate    Co-evaluation              AM-PAC PT "6 Clicks" Mobility   Outcome Measure  Help needed turning from your back to your side while in a flat bed without using bedrails?: A Lot Help needed moving from lying on your back to sitting on the side of a flat bed without using bedrails?: A Lot Help needed moving to and from a bed to a chair (including a wheelchair)?: A Little Help needed standing up from a chair using your arms (e.g., wheelchair or bedside chair)?: A Little Help needed to walk in hospital room?: A Lot Help needed climbing 3-5  steps with a railing? : Total 6 Click Score: 13    End of Session   Activity Tolerance: Treatment limited secondary to medical complications (Comment);Patient limited by pain;Patient limited by fatigue (feels generally poor, woozie, nausead throughout) Patient left: in bed;with call Sundquist/phone within reach;with SCD's reapplied;with bed alarm set Nurse Communication: Mobility status PT Visit Diagnosis: Difficulty in walking, not elsewhere classified (R26.2);Muscle weakness (generalized) (M62.81);Dizziness and giddiness (R42);Other symptoms and signs involving the nervous system (R29.898)     Time: 8676-7209 PT Time Calculation (min) (ACUTE ONLY): 42 min  Charges:  $Gait Training: 8-22 mins $Therapeutic Exercise: 8-22 mins                    11:30 AM, 11/21/20 Rosamaria Lints, PT, DPT Physical Therapist - [facility]  [phone] (ASCOM)      Kanaan Kagawa C 11/21/2020, 11:28 AM

## 2020-11-21 NOTE — Progress Notes (Signed)
Physical Therapy Treatment Patient Details Name: Kristi Mccoy MRN: [id number] DOB: 02-24-1966 Today's Date: 11/21/2020   History of Present Illness Kristi Mccoy is a 55yoF who comes to [facility] on 11/20/20 for elective Left TKA.    PT Comments    Attempted 2nd treatment session after lunch, but pain asks to rest due to increased pain after dressing change. Author returns an hour later, pt agreeable to limited session after some encouragement. Pt reports her dizziness issue not as pronouned, nausea improved, however she continues to not eat. Pt educated on HEP with handout using RLE as teaching model, then educated on self assist on LLE due to severe weakness in leg. Pt unable to move more than ankle pumps without maxA of limb. Pt able to perform SAQ, ABDCT, heel slides with bed sheet around foot, but takes pain breaks between 2 sets of 5. Pt left in bed at end of session, HOB elevated to simulate chair-like environment, discussed leaving some bend in knee of bed for 10-15 minutes post session as patient is struggling with flexion tolerance. Also feel that giving a brief interval without KI and polar care will allow pt to grow a better relationship with movement in the context of her post surgical status. Pt given extensive encouragement and education on symptoms modification, HEP compliance, expectations of what level of mobility and independence she should be striving for.      Recommendations for follow up therapy are one component of a multi-disciplinary discharge planning process, led by the attending physician.  Recommendations may be updated based on patient status, additional functional criteria and insurance authorization.  Follow Up Recommendations  SNF;Supervision for mobility/OOB     Equipment Recommendations  Rolling walker with 5" wheels;3in1 (PT)    Recommendations for Other Services       Precautions / Restrictions Precautions Precautions: Fall;Knee Precaution Booklet Issued: Yes  (comment) Required Braces or Orthoses: Knee Immobilizer - Left Restrictions Weight Bearing Restrictions: Yes LLE Weight Bearing: Weight bearing as tolerated     Mobility  Bed Mobility               General bed mobility comments: No bed mobility this session, mostly HEP education. Pt remains pain limited and not agreeable to OOB at this time.    Transfers                    Ambulation/Gait                 Stairs             Wheelchair Mobility    Modified Rankin (Stroke Patients Only)       Balance                                            Cognition Arousal/Alertness: Awake/alert Behavior During Therapy: WFL for tasks assessed/performed Overall Cognitive Status: Within Functional Limits for tasks assessed                                        Exercises Total Joint Exercises Ankle Circles/Pumps: AROM;10 reps;Supine;Left Quad Sets: AROM;Supine;15 reps;Left Short Arc Quad: AAROM;Left;10 reps;Supine;Limitations Short Arc Quad Limitations: educated on self assist with bed sheet, needs a pain break every 5 reps. Heel Slides: AAROM;Left;Supine;10 reps;Limitations Heel Slides  Limitations: educated on self assist with bed sheet, needs a pain break every 5 reps. Hip ABduction/ADduction: Supine;Left;AAROM;10 reps;Limitations Hip Abduction/Adduction Limitations: educated on self assist with bed sheet, needs a pain break every 5 reps. General Exercises - Lower Extremity Ankle Circles/Pumps: AROM;Supine;Right;10 reps Quad Sets: AROM;10 reps;Supine;Right Short Arc Quad: AROM;Right;15 reps;Supine Heel Slides: AROM;Right;15 reps;Supine Hip ABduction/ADduction: AROM;Right;15 reps;Supine Straight Leg Raises: AROM;Right;Supine;10 reps    General Comments        Pertinent Vitals/Pain Pain Assessment: Faces Faces Pain Scale: Hurts even more Pain Location: Operative knee Pain Descriptors / Indicators:  Guarding;Grimacing;Sore;Throbbing;Tightness Pain Intervention(s): Limited activity within patient's tolerance;Monitored during session    Home Living                      Prior Function            PT Goals (current goals can now be found in the care plan section) Acute Rehab PT Goals Patient Stated Goal: "To be as independent as possible, so I can get my life back." PT Goal Formulation: With patient Time For Goal Achievement: 12/04/20 Potential to Achieve Goals: Fair Progress towards PT goals: Not progressing toward goals - comment    Frequency    BID      PT Plan Current plan remains appropriate    Co-evaluation              AM-PAC PT "6 Clicks" Mobility   Outcome Measure  Help needed turning from your back to your side while in a flat bed without using bedrails?: A Lot Help needed moving from lying on your back to sitting on the side of a flat bed without using bedrails?: A Lot Help needed moving to and from a bed to a chair (including a wheelchair)?: A Little Help needed standing up from a chair using your arms (e.g., wheelchair or bedside chair)?: A Little Help needed to walk in hospital room?: A Lot Help needed climbing 3-5 steps with a railing? : A Lot 6 Click Score: 14    End of Session   Activity Tolerance: Patient limited by pain Patient left: in bed;with call Budden/phone within reach;with SCD's reapplied;with bed alarm set Nurse Communication: Mobility status PT Visit Diagnosis: Difficulty in walking, not elsewhere classified (R26.2);Muscle weakness (generalized) (M62.81);Dizziness and giddiness (R42);Other symptoms and signs involving the nervous system (R29.898)     Time: 6203-5597 PT Time Calculation (min) (ACUTE ONLY): 24 min  Charges:  $Therapeutic Exercise: 23-37 mins                    3:33 PM, 11/21/20 Rosamaria Lints, PT, DPT Physical Therapist - [facility]  [phone] (ASCOM)      Psalm Arman C 11/21/2020, 3:27 PM

## 2020-11-21 NOTE — TOC Progression Note (Signed)
Transition of Care [facility]) - Progression Note    Patient Details  Name: Kristi Mccoy MRN: [id number] Date of Birth: 09-08-1965  Transition of Care [facility] CM/SW [place], RN Phone Number: 11/21/2020, 3:47 PM  Clinical Narrative:     Met with the patient and reviewed the bed offers in detail, She wrote each one down and will review herself as well, she asked that we talk about it in the morning as she has just had Dilaudid and is feeling woozy, TOC will speak with the patient in the morning and will review       Expected Discharge Plan and Services                                                 Social Determinants of Health (SDOH) Interventions    Readmission Risk Interventions No flowsheet data found.

## 2020-11-21 NOTE — Evaluation (Signed)
Occupational Therapy Evaluation Patient Details Name: Kristi Mccoy MRN: [id number] DOB: 26-Jan-1966 Today's Date: 11/21/2020   History of Present Illness Kristi Mccoy is a 55yoF who comes to [facility] on 11/20/20 for elective Left TKA.   Clinical Impression   Ms. Goertzen was seen for OT evaluation this date, POD#1 from above surgery. Pt reports she was was independent in all ADLs prior to surgery, however she was significantly limited in her functional abilities and activity 2/2 L knee pain. Pt is eager to return to a more independent life with less pain and improved safety and independence. Pt currently requires MODERATE assist for LB dressing while in seated position due to pain and limited AROM of L knee. Pt instructed in polar care mgt, falls prevention strategies, home/routines modifications, DME/AE for LB bathing and dressing tasks, and compression stocking mgt. Time taken to monitor orthostatic vitals during session. Pt reports moderate dizziness with positional change, but does not express further symptoms. See chart below for vitals.   Pt would benefit from skilled OT services including additional instruction in dressing techniques with or without assistive devices for dressing and bathing skills to support recall and carryover prior to discharge and ultimately to maximize safety, independence, and minimize falls risk and caregiver burden. Recommend STR upon acute DC.     11/21/20 0900  Orthostatic Lying   BP- Lying 168/85  Pulse- Lying 87  Orthostatic Sitting  BP- Sitting 152/90  Pulse- Sitting 82  Orthostatic Standing at 0 minutes  BP- Standing at 0 minutes 139/79  Pulse- Standing at 0 minutes 83  Oxygen Therapy  SpO2 96 %   Vitals at end of session: 161/93 pulse 83 with pt seated in room recliner, legs elevated.          Recommendations for follow up therapy are one component of a multi-disciplinary discharge planning process, led by the attending physician.  Recommendations may be  updated based on patient status, additional functional criteria and insurance authorization.   Follow Up Recommendations  SNF    Equipment Recommendations  3 in 1 bedside commode    Recommendations for Other Services       Precautions / Restrictions Precautions Precautions: Fall;Knee Precaution Booklet Issued: Yes (comment) Required Braces or Orthoses: Knee Immobilizer - Left Knee Immobilizer - Left: Other (comment) (to promote TKE when in bed, can remove for PT and mobility) Restrictions Weight Bearing Restrictions: Yes LLE Weight Bearing: Weight bearing as tolerated      Mobility Bed Mobility Overal bed mobility: Needs Assistance Bed Mobility: Sit to Supine;Supine to Sit     Supine to sit: Supervision          Transfers Overall transfer level: Needs assistance   Transfers: Stand Pivot Transfers   Stand pivot transfers: Supervision;From elevated surface       General transfer comment: Min guard transitioning to close supervision for STS from bed at minimally elevated height.    Balance Overall balance assessment: Needs assistance Sitting-balance support: Feet supported;Bilateral upper extremity supported;Single extremity supported Sitting balance-Leahy Scale: Good Sitting balance - Comments: Steady static sitting, reaching within BOS   Standing balance support: During functional activity;Bilateral upper extremity supported Standing balance-Leahy Scale: Fair Standing balance comment: Heavy reliance on RW for functional mobility                           ADL either performed or assessed with clinical judgement   ADL Overall ADL's : Needs assistance/impaired  General ADL Comments: Pt is limited by decreased activity tolerance, increased pain and decreased AROM of her LLE. She requires supervision for bed/functional mobility (brief SPT to room recliner), Anticipate MOD A for LB ADL management in  seated position.     Vision Patient Visual Report: No change from baseline       Perception     Praxis      Pertinent Vitals/Pain Pain Assessment: 0-10 Pain Score: 7  Pain Location: Operative knee Pain Descriptors / Indicators: Aching;Guarding;Grimacing;Sore Pain Intervention(s): Limited activity within patient's tolerance;Monitored during session;Repositioned;Ice applied;Utilized relaxation techniques     Hand Dominance Right   Extremity/Trunk Assessment Upper Extremity Assessment Upper Extremity Assessment: Overall WFL for tasks assessed   Lower Extremity Assessment Lower Extremity Assessment: LLE deficits/detail LLE Deficits / Details: s/p L TKA; WBAT, pain limited. LLE Coordination: decreased gross motor   Cervical / Trunk Assessment Cervical / Trunk Assessment: Normal   Communication Communication Communication: No difficulties   Cognition Arousal/Alertness: Awake/alert Behavior During Therapy: WFL for tasks assessed/performed Overall Cognitive Status: Within Functional Limits for tasks assessed                                     General Comments       Exercises Other Exercises Other Exercises: Pt educated in falls prevention strategies, safe use of AE/DME for LB ADL management, polar care management, complementary alternative methods for pain management including gentle self-massage and distraction techniques, and routines modifications to support safety and fxl independence during meaningful occupations of daily life.   Shoulder Instructions      Home Living Family/patient expects to be discharged to:: Skilled nursing facility Living Arrangements: Spouse/significant other   Type of Home: House Home Access: Stairs to enter Entrance Stairs-Number of Steps: 6   Home Layout: Two level Alternate Level Stairs-Number of Steps: 13 Alternate Level Stairs-Rails: Right;Left           Home Equipment: None          Prior  Functioning/Environment Level of Independence: Independent with assistive device(s)  Gait / Transfers Assistance Needed: Pt reports she has very limited mobility at baseline 2/2 Knee pain. She states that she pushes herself to complete IADL management such as grocery shopping, yardwork, etc. because she has no other options. ADL's / Homemaking Assistance Needed: Pt independent at baseline for ADL management.            OT Problem List: Decreased strength;Decreased coordination;Cardiopulmonary status limiting activity;Decreased activity tolerance;Decreased safety awareness;Impaired balance (sitting and/or standing);Decreased knowledge of use of DME or AE;Decreased knowledge of precautions      OT Treatment/Interventions: Self-care/ADL training;Therapeutic exercise;Therapeutic activities;DME and/or AE instruction;Patient/family education;Balance training    OT Goals(Current goals can be found in the care plan section) Acute Rehab OT Goals Patient Stated Goal: "To be as independent as possible, so I can get my life back." OT Goal Formulation: With patient Time For Goal Achievement: 12/05/20 Potential to Achieve Goals: Good ADL Goals Pt Will Perform Lower Body Dressing: sit to/from stand;with set-up;with supervision;with adaptive equipment Pt Will Transfer to Toilet: bedside commode;with set-up;with supervision;ambulating Pt Will Perform Toileting - Clothing Manipulation and hygiene: with modified independence;sit to/from stand  OT Frequency: Min 2X/week   Barriers to D/C: Decreased caregiver support;Inaccessible home environment          Co-evaluation              AM-PAC OT "6  Clicks" Daily Activity     Outcome Measure Help from another person eating meals?: A Little Help from another person taking care of personal grooming?: A Little Help from another person toileting, which includes using toliet, bedpan, or urinal?: A Little Help from another person bathing (including  washing, rinsing, drying)?: A Little Help from another person to put on and taking off regular upper body clothing?: A Little Help from another person to put on and taking off regular lower body clothing?: A Little 6 Click Score: 18   End of Session Equipment Utilized During Treatment: Rolling walker;Gait belt  Activity Tolerance: Patient tolerated treatment well Patient left: in chair;with call Yarde/phone within reach;with chair alarm set  OT Visit Diagnosis: Other abnormalities of gait and mobility (R26.89);Pain Pain - Right/Left: Right Pain - part of body: Knee;Leg                Time: 3383-2919 OT Time Calculation (min): 66 min Charges:  OT General Charges $OT Visit: 1 Visit OT Evaluation $OT Eval Moderate Complexity: 1 Mod OT Treatments $Self Care/Home Management : 38-52 mins  Rockney Ghee, M.S., OTR/L Ascom: [phone] 11/21/20, 11:27 AM
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
## 2020-11-22 LAB — CBC
HCT: 34.2 % — ABNORMAL LOW (ref 36.0–46.0)
Hemoglobin: 11.3 g/dL — ABNORMAL LOW (ref 12.0–15.0)
MCH: 27.4 pg (ref 26.0–34.0)
MCHC: 33 g/dL (ref 30.0–36.0)
MCV: 83 fL (ref 80.0–100.0)
Platelets: 248 10*3/uL (ref 150–400)
RBC: 4.12 MIL/uL (ref 3.87–5.11)
RDW: 14 % (ref 11.5–15.5)
WBC: 13.3 10*3/uL — ABNORMAL HIGH (ref 4.0–10.5)
nRBC: 0 % (ref 0.0–0.2)

## 2020-11-22 LAB — GLUCOSE, CAPILLARY
Glucose-Capillary: 122 mg/dL — ABNORMAL HIGH (ref 70–99)
Glucose-Capillary: 116 mg/dL — ABNORMAL HIGH (ref 70–99)
Glucose-Capillary: 99 mg/dL (ref 70–99)
Glucose-Capillary: 116 mg/dL — ABNORMAL HIGH (ref 70–99)

## 2020-11-22 LAB — SARS CORONAVIRUS 2 (TAT 6-24 HRS): SARS Coronavirus 2: NEGATIVE

## 2020-11-22 NOTE — Progress Notes (Signed)
Physical Therapy Treatment Patient Details Name: Kristi Mccoy MRN: [id number] DOB: [birth year] Today's Date: 11/22/2020   History of Present Illness Kristi Mccoy is a 55yoF who comes to [facility] on 11/20/20 for elective Left TKA.    PT Comments    Pt awake in a dark room upon entry,. Pt reports trying to focus on reducing her HA and other symptoms. She is vague when asked about her queasiness, but it seems like it is better since yesterday, comments today more centric to LUQ pain and flatus related ABD pain. Pt ate a couple bites of bacon, nothing else for breakfast. Reports she had jello and a cracker for dinner. Pt assisted with HEP education, continued education on polar care, KI and TKA posturing at rest with towel roll. Pt still unable to perform any exercises without physical assist (expect for ankle pumps), reports she did 3 reps of 1 exercise last night using a bedsheet around foot as shown. No dizziness at EOB. Pt performs STS 3 times, follows cues for technique, requires elevated height and heavy UE use. AMB at bedside with continued encouragement, pt anxious about to-be-expected levels of pain and lightheadedness that is more likely medication related than BP related at this point. Pt educated on goals of chair time today.  Recommendations for follow up therapy are one component of a multi-disciplinary discharge planning process, led by the attending physician.  Recommendations may be updated based on patient status, additional functional criteria and insurance authorization.  Follow Up Recommendations  SNF;Supervision for mobility/OOB     Equipment Recommendations  Rolling walker with 5" wheels;3in1 (PT)    Recommendations for Other Services       Precautions / Restrictions Precautions Precautions: Fall;Knee Precaution Booklet Issued: Yes (comment) Required Braces or Orthoses: Knee Immobilizer - Left Knee Immobilizer - Left: Other (comment) Restrictions LLE Weight Bearing: Weight  bearing as tolerated     Mobility  Bed Mobility Overal bed mobility: Needs Assistance Bed Mobility: Supine to Sit     Supine to sit: Supervision          Transfers Overall transfer level: Needs assistance Equipment used: Rolling walker (2 wheeled)   Sit to Stand: Min guard         General transfer comment: 3x from elevated surface, extensive education provided for safe/independence technique  Ambulation/Gait Ambulation/Gait assistance: Min guard Gait Distance (Feet): 18 Feet Assistive device: Rolling walker (2 wheeled) Gait Pattern/deviations: Step-to pattern Gait velocity: heavy encouragement to achive this distance   General Gait Details: no cues given for technique due to severe pain limitations and anxiety, pt asked to determine he most affective and confident strategy, emphasis on safety and mobility. (not tolerating more than TDWB LLE, heavy UE use, anxious about dilaudid related lightheadedness and pain levels)   Stairs             Wheelchair Mobility    Modified Rankin (Stroke Patients Only)       Balance Overall balance assessment: Modified Independent;No apparent balance deficits (not formally assessed)                                          Cognition Arousal/Alertness: Awake/alert Behavior During Therapy: Anxious;Impulsive;WFL for tasks assessed/performed Overall Cognitive Status: Within Functional Limits for tasks assessed  Exercises Total Joint Exercises Heel Slides: AAROM;Left;Supine;Limitations;15 reps Hip ABduction/ADduction: Supine;Left;AAROM;Limitations;15 reps Goniometric ROM: 25-76 degrees Left knee ROM (tolerates far more flexion at EOB than in supine.    General Comments        Pertinent Vitals/Pain Pain Assessment: 0-10 Pain Score: 4  Pain Location: Operative knee Pain Descriptors / Indicators: Guarding;Grimacing;Sore;Throbbing;Tightness Pain  Intervention(s): Monitored during session;Premedicated before session;Ice applied    Home Living                      Prior Function            PT Goals (current goals can now be found in the care plan section) Acute Rehab PT Goals Patient Stated Goal: "To be as independent as possible, so I can get my life back." PT Goal Formulation: With patient Time For Goal Achievement: 12/04/20 Potential to Achieve Goals: Fair Progress towards PT goals: Progressing toward goals    Frequency    BID      PT Plan Current plan remains appropriate    Co-evaluation              AM-PAC PT "6 Clicks" Mobility   Outcome Measure  Help needed turning from your back to your side while in a flat bed without using bedrails?: A Lot Help needed moving from lying on your back to sitting on the side of a flat bed without using bedrails?: A Lot Help needed moving to and from a bed to a chair (including a wheelchair)?: A Little Help needed standing up from a chair using your arms (e.g., wheelchair or bedside chair)?: A Little Help needed to walk in hospital room?: A Lot Help needed climbing 3-5 steps with a railing? : A Lot 6 Click Score: 14    End of Session Equipment Utilized During Treatment: Gait belt Activity Tolerance: Patient tolerated treatment well;Other (comment) (limited to anxiety, fear avoidance behaviors) Patient left: with call Correnti/phone within reach;with SCD's reapplied;in chair Nurse Communication: Mobility status PT Visit Diagnosis: Difficulty in walking, not elsewhere classified (R26.2);Muscle weakness (generalized) (M62.81);Dizziness and giddiness (R42);Other symptoms and signs involving the nervous system (R29.898)     Time: 0938-1829 PT Time Calculation (min) (ACUTE ONLY): 42 min  Charges:  $Gait Training: 8-22 mins $Therapeutic Exercise: 23-37 mins                    10:58 AM, 11/22/20 Rosamaria Lints, PT, DPT Physical Therapist - [facility]  [phone] (ASCOM)     Jabari Swoveland C 11/22/2020, 10:52 AM

## 2020-11-22 NOTE — Progress Notes (Signed)
  Subjective:  POD #2 s/p left total knee arthroplasty.   Patient reports left knee pain as mild to moderate.  Patient states her pain is improved compared to yesterday.  Patient had a lot of pain this morning which limited her participation with therapy.  I reviewed the physical therapy notes and the patient made better progress this afternoon.  Patient states earlier she had left upper quadrant pain which she believes was due to gas.  Patient is passing flatus but has not yet had a bowel movement.  Patient is awaiting insurance approval to go to a SNF.  Patient has been recommended for SNF by physical therapy.  Objective:   VITALS:   Vitals:   11/22/20 0447 11/22/20 0725 11/22/20 0948 11/22/20 1512  BP: (!) 152/85 (!) 170/84 (!) 166/96 (!) 154/81  Pulse: 95 91 91 84  Resp: 18 15  16   Temp: 99.9 F (37.7 C) (!) 97.5 F (36.4 C)  98.4 F (36.9 C)  TempSrc: Oral     SpO2: 97% 92%  91%  Weight:      Height:        PHYSICAL EXAM: Left lower extremity Neurovascular intact Sensation intact distally Intact pulses distally Dorsiflexion/Plantar flexion intact Incision: dressing C/D/I No cellulitis present Compartment soft  LABS  Results for orders placed or performed during the hospital encounter of 11/20/20 (from the past 24 hour(s))  Glucose, capillary     Status: Abnormal   Collection Time: 11/21/20  8:42 PM  Result Value Ref Range   Glucose-Capillary 136 (H) 70 - 99 mg/dL  CBC     Status: Abnormal   Collection Time: 11/22/20  3:20 AM  Result Value Ref Range   WBC 13.3 (H) 4.0 - 10.5 K/uL   RBC 4.12 3.87 - 5.11 MIL/uL   Hemoglobin 11.3 (L) 12.0 - 15.0 g/dL   HCT 11/24/20 (L) 95.2 - 84.1 %   MCV 83.0 80.0 - 100.0 fL   MCH 27.4 26.0 - 34.0 pg   MCHC 33.0 30.0 - 36.0 g/dL   RDW 32.4 40.1 - 02.7 %   Platelets 248 150 - 400 K/uL   nRBC 0.0 0.0 - 0.2 %  Glucose, capillary     Status: Abnormal   Collection Time: 11/22/20  7:51 AM  Result Value Ref Range   Glucose-Capillary 122  (H) 70 - 99 mg/dL  Glucose, capillary     Status: Abnormal   Collection Time: 11/22/20 12:25 PM  Result Value Ref Range   Glucose-Capillary 116 (H) 70 - 99 mg/dL  Glucose, capillary     Status: None   Collection Time: 11/22/20  5:00 PM  Result Value Ref Range   Glucose-Capillary 99 70 - 99 mg/dL    No results found.  Assessment/Plan: 2 Days Post-Op   Active Problems:   S/P TKR (total knee replacement) using cement, left  Patient making progress with physical therapy.  Continue PT.  Continue to use Polar Care and elevate to prevent significant swelling.  Continue bowel regimen to assist patient to have a bowel movement.  Patient will be discharged to a SNF once she has insurance approval.    11/24/20 , MD 11/22/2020, 6:12 PM

## 2020-11-22 NOTE — TOC Progression Note (Addendum)
Transition of Care [facility]) - Progression Note    Patient Details  Name: Kristi Mccoy MRN: [id number] Date of Birth: 1966/02/24  Transition of [facility]) CM/SW Contact  Barrie Dunker, RN Phone Number: 11/22/2020, 9:17 AM  Clinical Narrative:     Reviewed the bed offers with the patient, she stated that she did a virtual tour and had her friend check into the facilities and she chose [facility].  I contacted Star  at [facility] and accepted the bed offer, The patient  had Covid two weeks ago and is concerned that if she has to do a Covid test it could show as Positive. I requested Star to give me a call back to discuss , I requested Star to start the insurance approval process       Expected Discharge Plan and Services                                                 Social Determinants of Health (SDOH) Interventions    Readmission Risk Interventions No flowsheet data found.

## 2020-11-22 NOTE — Progress Notes (Signed)
Physical Therapy Treatment Patient Details Name: [name] MRN: [id number] DOB: 1965-05-12 Today's Date: 11/22/2020   History of Present Illness Kristi Mccoy is a 55yoF who comes to [facility] on 11/20/20 for elective Left TKA.    PT Comments    Patient back in bed upon arrival, reports she needed to use bathroom and there was no BSC in room hence had to use bedpan in bed.  Patient agreeable to session, reports pain currently 2 out of 10 and knee.  Began with active assisted range of motion exercises patient using a gait belt to assist her leg, able to progress to 15 reps, however she does have to take intermittent breaks due to pain.  Patient able to get to edge of bed self-assessed of left leg, good carryover technique for sit to stand transfer with rolling walker and elevated surface. Patient able to advance AMB to 50 feet, notable improvements in speed quality and anxiety in session.  Patient left up in recliner at end of session, RN coming to bring medications shortly.  Recommendations for follow up therapy are one component of a multi-disciplinary discharge planning process, led by the attending physician.  Recommendations may be updated based on patient status, additional functional criteria and insurance authorization.  Follow Up Recommendations  SNF;Supervision for mobility/OOB     Equipment Recommendations  Rolling walker with 5" wheels;3in1 (PT)    Recommendations for Other Services       Precautions / Restrictions Precautions Precautions: Fall;Knee Precaution Booklet Issued: Yes (comment) Required Braces or Orthoses: Knee Immobilizer - Left Knee Immobilizer - Left: Other (comment) Restrictions LLE Weight Bearing: Weight bearing as tolerated     Mobility  Bed Mobility Overal bed mobility: Needs Assistance Bed Mobility: Supine to Sit     Supine to sit: Supervision     General bed mobility comments: Pt uses Ue to grab Left sock and move LLE oob usign this technique     Transfers Overall transfer level: Needs assistance Equipment used: Rolling walker (2 wheeled) Transfers: Sit to/from Stand Sit to Stand: Supervision;From elevated surface            Ambulation/Gait   Gait Distance (Feet): 50 Feet Assistive device: Rolling walker (2 wheeled) Gait Pattern/deviations: Step-to pattern     General Gait Details: less anxious, gait faster adn more smooth, less notable UE fatigue indicative of improved WB tolerance in LLE (suspect PWB 50% tolerance this session).   Stairs             Wheelchair Mobility    Modified Rankin (Stroke Patients Only)       Balance                                            Cognition Arousal/Alertness: Awake/alert Behavior During Therapy: Anxious;WFL for tasks assessed/performed Overall Cognitive Status: Within Functional Limits for tasks assessed                                        Exercises Total Joint Exercises Short Arc QuadBarbaraann Boys;Left;Supine;Limitations;15 reps Short Arc Quad Limitations: uses a gait belt for self assist Heel Slides: AAROM;Left;Supine;Limitations;15 reps Heel Slides Limitations: uses a gait belt for self assist Hip ABduction/ADduction: Supine;Left;AAROM;Limitations;15 reps Hip Abduction/Adduction Limitations: uses a gait belt for self assist    General  Comments        Pertinent Vitals/Pain Pain Assessment: 0-10 Pain Score: 2  Pain Location: Operative knee Pain Descriptors / Indicators: Guarding;Grimacing;Sore;Throbbing;Tightness Pain Intervention(s): Monitored during session;Premedicated before session;Repositioned;Ice applied    Home Living                      Prior Function            PT Goals (current goals can now be found in the care plan section) Acute Rehab PT Goals Patient Stated Goal: "To be as independent as possible, so I can get my life back." PT Goal Formulation: With patient Time For Goal Achievement:  12/04/20 Potential to Achieve Goals: Good Progress towards PT goals: Progressing toward goals    Frequency    BID      PT Plan Current plan remains appropriate    Co-evaluation              AM-PAC PT "6 Clicks" Mobility   Outcome Measure  Help needed turning from your back to your side while in a flat bed without using bedrails?: A Lot Help needed moving from lying on your back to sitting on the side of a flat bed without using bedrails?: A Lot Help needed moving to and from a bed to a chair (including a wheelchair)?: A Little Help needed standing up from a chair using your arms (e.g., wheelchair or bedside chair)?: A Little Help needed to walk in hospital room?: A Lot Help needed climbing 3-5 steps with a railing? : A Lot 6 Click Score: 14    End of Session Equipment Utilized During Treatment: Gait belt Activity Tolerance: Patient tolerated treatment well;Other (comment) Patient left: with call [place]/phone within reach;in chair;with chair alarm set Nurse Communication: Mobility status PT Visit Diagnosis: Difficulty in walking, not elsewhere classified (R26.2);Muscle weakness (generalized) (M62.81);Dizziness and giddiness (R42);Other symptoms and signs involving the nervous system (R29.898)     Time: 7026-3785 PT Time Calculation (min) (ACUTE ONLY): 40 min  Charges:  $Gait Training: 8-22 mins $Therapeutic Exercise: 23-37 mins                    3:00 PM, 11/22/20 Rosamaria Lints, PT, DPT Physical Therapist - [facility]  [phone] (ASCOM)     Lavanya Roa C 11/22/2020, 2:58 PM

## 2020-11-22 NOTE — TOC Progression Note (Signed)
Transition of Care [facility]) - Progression Note    Patient Details  Name: Kristi Mccoy MRN: [id number] Date of Birth: 19-Mar-1965  Transition of Care [facility]) CM/SW Contact  Barrie Dunker, RN Phone Number: 11/22/2020, 11:00 AM  Clinical Narrative:     [facility] me that they do not take [employer] they would have to rescind the bed offer, I notified the patient and she made the choice to go to [facility], I called [facility] and left a secure VM and requested a call back and requested that the insurance process be started with the anticipated DC date for Tomorrow       Expected Discharge Plan and Services                                                 Social Determinants of Health (SDOH) Interventions    Readmission Risk Interventions No flowsheet data found.

## 2020-11-23 LAB — GLUCOSE, CAPILLARY
Glucose-Capillary: 111 mg/dL — ABNORMAL HIGH (ref 70–99)
Glucose-Capillary: 137 mg/dL — ABNORMAL HIGH (ref 70–99)
Glucose-Capillary: 101 mg/dL — ABNORMAL HIGH (ref 70–99)
Glucose-Capillary: 133 mg/dL — ABNORMAL HIGH (ref 70–99)

## 2020-11-23 NOTE — Progress Notes (Signed)
  Subjective:  Patient seen at 1:45 PM.  POD #2 s/p left TKA.   Patient reports left knee pain as mild to moderate.  Pain is improving.  Making progress with PT.  Passing flatus, no BM.  Objective:   VITALS:   Vitals:   11/23/20 0550 11/23/20 0859 11/23/20 1153 11/23/20 1635  BP: (!) 143/86 130/71 123/75 130/79  Pulse: 93 78 94 94  Resp: 20 17 17    Temp: 98.9 F (37.2 C) 99 F (37.2 C) 98.7 F (37.1 C) 98.5 F (36.9 C)  TempSrc:      SpO2: 94% 94% 92% 95%  Weight:      Height:        PHYSICAL EXAM: Left lower extremity Neurovascular intact Sensation intact distally Intact pulses distally Dorsiflexion/Plantar flexion intact Incision: no drainage No cellulitis present Compartment soft  LABS  Results for orders placed or performed during the hospital encounter of 11/20/20 (from the past 24 hour(s))  Glucose, capillary     Status: None   Collection Time: 11/22/20  5:00 PM  Result Value Ref Range   Glucose-Capillary 99 70 - 99 mg/dL  Glucose, capillary     Status: Abnormal   Collection Time: 11/22/20  8:43 PM  Result Value Ref Range   Glucose-Capillary 116 (H) 70 - 99 mg/dL  Glucose, capillary     Status: Abnormal   Collection Time: 11/23/20  7:59 AM  Result Value Ref Range   Glucose-Capillary 111 (H) 70 - 99 mg/dL  Glucose, capillary     Status: Abnormal   Collection Time: 11/23/20  1:33 PM  Result Value Ref Range   Glucose-Capillary 137 (H) 70 - 99 mg/dL  Glucose, capillary     Status: Abnormal   Collection Time: 11/23/20  4:29 PM  Result Value Ref Range   Glucose-Capillary 101 (H) 70 - 99 mg/dL    No results found.  Assessment/Plan: 3 Days Post-Op   Active Problems:   S/P TKR (total knee replacement) using cement, left  Continue PT.   Patient awaiting insurance approval for SNF.      11/25/20 , MD 11/23/2020, 4:37 PM

## 2020-11-23 NOTE — Progress Notes (Signed)
Physical Therapy Treatment Patient Details Name: Kristi Mccoy MRN: [id number] DOB: February 24, 1966 Today's Date: 11/23/2020   History of Present Illness Kristi Mccoy is a 55yoF who comes to [facility] on 11/20/20 for elective Left TKA.    PT Comments    Entered patient's room and requesting pain medication. Nurse notified. Pt completed bed mobility and sit to stand transfer with minA + RW for safety. Pt able to ambulate 90 ft with RW + CGA and cues to improve quality of gait pattern. Pt AAROM L knee flexion 60 degrees and lacking 23 deg of L knee extension. Pt educated on supine exercise to progress L knee flexion. Pt will continue to benefit from skilled PT services to improve L knee range of motion, strength, and functional mobility.     Recommendations for follow up therapy are one component of a multi-disciplinary discharge planning process, led by the attending physician.  Recommendations may be updated based on patient status, additional functional criteria and insurance authorization.  Follow Up Recommendations  SNF;Supervision for mobility/OOB     Equipment Recommendations  Rolling walker with 5" wheels    Recommendations for Other Services       Precautions / Restrictions Precautions Precautions: Fall;Knee Precaution Booklet Issued: Yes (comment) Required Braces or Orthoses: Knee Immobilizer - Left Restrictions Weight Bearing Restrictions: Yes LLE Weight Bearing: Weight bearing as tolerated     Mobility  Bed Mobility Overal bed mobility: Needs Assistance Bed Mobility: Supine to Sit;Sit to Supine     Supine to sit: Min assist Sit to supine: Min assist   General bed mobility comments: MinA for management of L LE    Transfers Overall transfer level: Needs assistance Equipment used: Rolling walker (2 wheeled) Transfers: Sit to/from Stand Sit to Stand: Min guard Stand pivot transfers: Min guard       General transfer comment: CGA due to increased pain during  transfer  Ambulation/Gait Ambulation/Gait assistance: Min guard Gait Distance (Feet): 90 Feet Assistive device: Rolling walker (2 wheeled) Gait Pattern/deviations: Step-to pattern;Decreased step length - right;Decreased stance time - left Gait velocity: decreased   General Gait Details: Cued to try to take equal steps to promote more normal gait pattern   Stairs             Wheelchair Mobility    Modified Rankin (Stroke Patients Only)       Balance Overall balance assessment: Needs assistance Sitting-balance support: Feet supported Sitting balance-Leahy Scale: Good Sitting balance - Comments: Steady static sitting, reaching within BOS. No LOB appreciated with weight shift during LBD task.   Standing balance support: During functional activity Standing balance-Leahy Scale: Fair Standing balance comment: Heavy usage of UE on RW during ambulation. No loss of balance noted                            Cognition Arousal/Alertness: Awake/alert Behavior During Therapy: WFL for tasks assessed/performed Overall Cognitive Status: Within Functional Limits for tasks assessed                                 General Comments: Pain limited however, able to complete treatment      Exercises Total Joint Exercises Heel Slides: Seated;Supine;AAROM;15 reps Heel Slides Limitations: AAROM L knee ROM: 60 deg in supine    General Comments        Pertinent Vitals/Pain Pain Assessment: 0-10 Pain Score: 8  Pain  Location: Operative knee Pain Descriptors / Indicators: Guarding;Grimacing;Sore;Throbbing;Tightness Pain Intervention(s): Limited activity within patient's tolerance;Monitored during session;Patient requesting pain meds-RN notified;RN gave pain meds during session;Relaxation    Home Living                      Prior Function            PT Goals (current goals can now be found in the care plan section) Acute Rehab PT Goals Patient  Stated Goal: rehab then home PT Goal Formulation: With patient Time For Goal Achievement: 12/04/20 Potential to Achieve Goals: Good Progress towards PT goals: Progressing toward goals    Frequency    BID      PT Plan Current plan remains appropriate    Co-evaluation              AM-PAC PT "6 Clicks" Mobility   Outcome Measure  Help needed turning from your back to your side while in a flat bed without using bedrails?: A Lot Help needed moving from lying on your back to sitting on the side of a flat bed without using bedrails?: A Lot Help needed moving to and from a bed to a chair (including a wheelchair)?: A Little Help needed standing up from a chair using your arms (e.g., wheelchair or bedside chair)?: A Little Help needed to walk in hospital room?: A Little Help needed climbing 3-5 steps with a railing? : A Lot 6 Click Score: 15    End of Session Equipment Utilized During Treatment: Gait belt Activity Tolerance: Patient tolerated treatment well Patient left: in bed;with call [place]/phone within reach;with bed alarm set Nurse Communication: Mobility status PT Visit Diagnosis: Difficulty in walking, not elsewhere classified (R26.2);Muscle weakness (generalized) (M62.81);Dizziness and giddiness (R42);Other symptoms and signs involving the nervous system (R29.898)     Time: 5993-5701 PT Time Calculation (min) (ACUTE ONLY): 36 min  Charges:  $Gait Training: 8-22 mins $Therapeutic Activity: 8-22 mins                     Verl Blalock, SPT    Verl Blalock 11/23/2020, 4:15 PM

## 2020-11-23 NOTE — TOC Progression Note (Signed)
Transition of Care [facility]) - Progression Note    Patient Details  Name: Kristi Mccoy MRN: [id number] Date of Birth: 1965/06/02  Transition of Care [facility]) CM/SW Contact  Barrie Dunker, RN Phone Number: 11/23/2020, 10:49 AM  Clinical Narrative:        Reached out to [facility] to inquire on insurance auth status, still pending    Expected Discharge Plan and Services                                                 Social Determinants of Health (SDOH) Interventions    Readmission Risk Interventions No flowsheet data found.

## 2020-11-23 NOTE — Plan of Care (Signed)
  Problem: Education: Goal: Knowledge of the prescribed therapeutic regimen will improve Outcome: Progressing Goal: Individualized Educational Video(s) Outcome: Progressing   Problem: Activity: Goal: Ability to avoid complications of mobility impairment will improve Outcome: Progressing Goal: Range of joint motion will improve Outcome: Progressing   Problem: Clinical Measurements: Goal: Postoperative complications will be avoided or minimized Outcome: Progressing   Problem: Pain Management: Goal: Pain level will decrease with appropriate interventions Outcome: Progressing   Problem: Skin Integrity: Goal: Will show signs of wound healing Outcome: Progressing   Problem: Education: Goal: Knowledge of General Education information will improve Description: Including pain rating scale, medication(s)/side effects and non-pharmacologic comfort measures Outcome: Progressing   Problem: Health Behavior/Discharge Planning: Goal: Ability to manage health-related needs will improve Outcome: Progressing   Problem: Clinical Measurements: Goal: Ability to maintain clinical measurements within normal limits will improve Outcome: Progressing Goal: Will remain free from infection Outcome: Progressing Goal: Diagnostic test results will improve Outcome: Progressing Goal: Respiratory complications will improve Outcome: Progressing Goal: Cardiovascular complication will be avoided Outcome: Progressing   Problem: Activity: Goal: Risk for activity intolerance will decrease Outcome: Progressing   Problem: Nutrition: Goal: Adequate nutrition will be maintained Outcome: Progressing   Problem: Coping: Goal: Level of anxiety will decrease Outcome: Progressing   Problem: Elimination: Goal: Will not experience complications related to bowel motility Outcome: Progressing Goal: Will not experience complications related to urinary retention Outcome: Progressing   Problem: Pain  Managment: Goal: General experience of comfort will improve Outcome: Progressing   Problem: Skin Integrity: Goal: Risk for impaired skin integrity will decrease Outcome: Progressing

## 2020-11-23 NOTE — Progress Notes (Signed)
Physical Therapy Treatment Patient Details Name: Kristi Mccoy MRN: [id number] DOB: 1965-12-05 Today's Date: 11/23/2020   History of Present Illness Kristi Mccoy is a 55yoF who comes to [facility] on 11/20/20 for elective Left TKA.    PT Comments    Pt was sitting in recliner upon arriving. She was pre-medicated prior to session and motivated to participate. Was able to stand and ambulate with RW without LOB however has slow step to antalgic gait pattern. Pt is limited by pain still but overall tolerated session well. Reapplied knee immobilizer and ice post session. Highly recommend DC to SNF to address deficits while achieving maximum independence with ADLs. Will return this afternoon to address ROM and strength deficits.   Recommendations for follow up therapy are one component of a multi-disciplinary discharge planning process, led by the attending physician.  Recommendations may be updated based on patient status, additional functional criteria and insurance authorization.  Follow Up Recommendations  SNF;Supervision for mobility/OOB     Equipment Recommendations  Rolling walker with 5" wheels;3in1 (PT)       Precautions / Restrictions Precautions Precautions: Fall;Knee Precaution Booklet Issued: Yes (comment) Required Braces or Orthoses: Knee Immobilizer - Left Knee Immobilizer - Left: Other (comment) Restrictions Weight Bearing Restrictions: Yes LLE Weight Bearing: Weight bearing as tolerated     Mobility  Bed Mobility Overal bed mobility: Needs Assistance Bed Mobility: Supine to Sit     Supine to sit: Supervision;Min assist Sit to supine: Min assist   General bed mobility comments: Pt requires light MIN A for moving LLE over EOB this date. she is able to perform all other bed mobility with supervision.    Transfers Overall transfer level: Needs assistance Equipment used: Rolling walker (2 wheeled) Transfers: Sit to/from Stand Sit to Stand: Supervision Stand pivot transfers:  Min guard       General transfer comment: Pt return demos excellent understanding of safe transfer technique.  Ambulation/Gait Ambulation/Gait assistance: Min guard Gait Distance (Feet): 75 Feet Assistive device: Rolling walker (2 wheeled) Gait Pattern/deviations: Step-to pattern Gait velocity: decreased   General Gait Details: pt was able to ambulate 69ft with much improve pain tolerance and improve foot clearance     Balance Overall balance assessment: Modified Independent;No apparent balance deficits (not formally assessed) Sitting-balance support: Feet supported;No upper extremity supported Sitting balance-Leahy Scale: Good Sitting balance - Comments: Steady static sitting, reaching within BOS. No LOB appreciated with weight shift during LBD task.   Standing balance support: During functional activity;Bilateral upper extremity supported Standing balance-Leahy Scale: Fair Standing balance comment: Heavy reliance on RW for functional mobility      Cognition Arousal/Alertness: Awake/alert Behavior During Therapy: WFL for tasks assessed/performed Overall Cognitive Status: Within Functional Limits for tasks assessed          General Comments: Pain limited, but pleasant and conversational.      Exercises Other Exercises Other Exercises: Therapist facilitates bed mobility, safe transfer education, functional mobility in room, LB dressing task, and standing balance activities with consistent cueing for falls prevention and education on safe use of AE/DME for ADL management t/o session. Pt return verbalizes/demos understanding of edu provided.        Pertinent Vitals/Pain Pain Assessment: 0-10 Pain Score: 7  Pain Location: Operative knee Pain Descriptors / Indicators: Guarding;Grimacing;Sore;Throbbing;Tightness Pain Intervention(s): Limited activity within patient's tolerance;Monitored during session;Premedicated before session;Repositioned     PT Goals (current goals  can now be found in the care plan section) Acute Rehab PT Goals Patient Stated Goal: rehab  then home Progress towards PT goals: Progressing toward goals    Frequency    BID      PT Plan Current plan remains appropriate       AM-PAC PT "6 Clicks" Mobility   Outcome Measure  Help needed turning from your back to your side while in a flat bed without using bedrails?: A Lot Help needed moving from lying on your back to sitting on the side of a flat bed without using bedrails?: A Lot Help needed moving to and from a bed to a chair (including a wheelchair)?: A Little Help needed standing up from a chair using your arms (e.g., wheelchair or bedside chair)?: A Little Help needed to walk in hospital room?: A Little Help needed climbing 3-5 steps with a railing? : A Lot 6 Click Score: 15    End of Session Equipment Utilized During Treatment: Gait belt Activity Tolerance: Patient tolerated treatment well Patient left: with call [place]/phone within reach;in chair;with chair alarm set Nurse Communication: Mobility status PT Visit Diagnosis: Difficulty in walking, not elsewhere classified (R26.2);Muscle weakness (generalized) (M62.81);Dizziness and giddiness (R42);Other symptoms and signs involving the nervous system (R29.898)     Time: 6244-6950 PT Time Calculation (min) (ACUTE ONLY): 26 min  Charges:  $Gait Training: 8-22 mins $Therapeutic Activity: 8-22 mins                    Jetta Lout PTA 11/23/20, 3:43 PM

## 2020-11-23 NOTE — Progress Notes (Signed)
Occupational Therapy Treatment Patient Details Name: Kristi Mccoy MRN: [id number] DOB: November 15, 1965 Today's Date: 11/23/2020   History of present illness Kristi Mccoy is a 55yoF who comes to [facility] on 11/20/20 for elective Left TKA.   OT comments  [name] was seen for OT tx session this date. Pt received semi-supine in bed. She endorses 3/10 pain at rest and agreeable to OT tx session. Pt continues to be limited by decreased activity tolerance, increased pain and decreased AROM of her LLE. She continues to require close supervision for bed/functional mobility (brief SPT to room recliner). Therapist facilitates pt education on safe use of AE/DME for ADL management including safe use of: 3 in 1, LH sponge, LH reacher, & sock aide this date. She is able to don sock on her non-operative extremity with min cueing for technique and supervision for safety. She declines to don on LLE 2/2 increased pain this date. Pt continues to require MIN-MOD A for more complex LB ADL management. Pt with good recall of education from past sessions. She is making good progress toward OT goals and continues to benefit from skilled OT services. Will continue to follow POC as written. STR remains most appropriate DC disposition at this time.    Recommendations for follow up therapy are one component of a multi-disciplinary discharge planning process, led by the attending physician.  Recommendations may be updated based on patient status, additional functional criteria and insurance authorization.    Follow Up Recommendations  SNF    Equipment Recommendations  3 in 1 bedside commode    Recommendations for Other Services      Precautions / Restrictions Precautions Precautions: Fall;Knee Precaution Booklet Issued: Yes (comment) Required Braces or Orthoses: Knee Immobilizer - Left Knee Immobilizer - Left: Other (comment) Restrictions Weight Bearing Restrictions: Yes LLE Weight Bearing: Weight bearing as tolerated        Mobility Bed Mobility Overal bed mobility: Needs Assistance Bed Mobility: Supine to Sit     Supine to sit: Supervision;Min assist     General bed mobility comments: Pt requires light MIN A for moving LLE over EOB this date. she is able to perform all other bed mobility with supervision.    Transfers Overall transfer level: Needs assistance Equipment used: Rolling walker (2 wheeled) Transfers: Sit to/from Stand Sit to Stand: Supervision         General transfer comment: Pt return demos excellent understanding of safe transfer technique.    Balance Overall balance assessment: Modified Independent;No apparent balance deficits (not formally assessed) Sitting-balance support: Feet supported;No upper extremity supported Sitting balance-Leahy Scale: Good Sitting balance - Comments: Steady static sitting, reaching within BOS. No LOB appreciated with weight shift during LBD task.   Standing balance support: During functional activity;Bilateral upper extremity supported Standing balance-Leahy Scale: Fair Standing balance comment: Heavy reliance on RW for functional mobility                           ADL either performed or assessed with clinical judgement   ADL Overall ADL's : Needs assistance/impaired                                       General ADL Comments: Pt continues to be limited by decreased activity tolerance, increased pain and decreased AROM of her LLE. She continues to require close supervision for bed/functional mobility (brief SPT to  room recliner). Therapist facilitates pt education on safe use of AE/DME for ADL management including safe use of: 3 in 1, LH sponge, LH reacher, & Sock aide this date. She is able to don sock on her non-operative extremity with min cueing for technique and supervision for safety. She declines to don on LLE 2/2 increased pain this date. Pt continues to require MIN-MOD A for more complex LB ADL management.      Vision       Perception     Praxis      Cognition Arousal/Alertness: Awake/alert Behavior During Therapy: Anxious;WFL for tasks assessed/performed Overall Cognitive Status: Within Functional Limits for tasks assessed                                 General Comments: Pain limited, but pleasant and conversational.        Exercises Other Exercises Other Exercises: Therapist facilitates bed mobility, safe transfer education, functional mobility in room, LB dressing task, and standing balance activities with consistent cueing for falls prevention and education on safe use of AE/DME for ADL management t/o session. Pt return verbalizes/demos understanding of edu provided.   Shoulder Instructions       General Comments      Pertinent Vitals/ Pain       Pain Assessment: 0-10 Pain Score: 6  Pain Location: Operative knee Pain Descriptors / Indicators: Guarding;Grimacing;Sore;Throbbing;Tightness Pain Intervention(s): Limited activity within patient's tolerance;Monitored during session;Repositioned;Utilized relaxation techniques;Patient requesting pain meds-RN notified  Home Living                                          Prior Functioning/Environment              Frequency  Min 2X/week        Progress Toward Goals  OT Goals(current goals can now be found in the care plan section)  Progress towards OT goals: Progressing toward goals  Acute Rehab OT Goals Patient Stated Goal: "To be as independent as possible, so I can get my life back." OT Goal Formulation: With patient Time For Goal Achievement: 12/05/20 Potential to Achieve Goals: Good  Plan Discharge plan remains appropriate;Frequency remains appropriate    Co-evaluation                 AM-PAC OT "6 Clicks" Daily Activity     Outcome Measure   Help from another person eating meals?: A Little Help from another person taking care of personal grooming?: A Little Help  from another person toileting, which includes using toliet, bedpan, or urinal?: A Little Help from another person bathing (including washing, rinsing, drying)?: A Little Help from another person to put on and taking off regular upper body clothing?: A Little Help from another person to put on and taking off regular lower body clothing?: A Little 6 Click Score: 18    End of Session Equipment Utilized During Treatment: Rolling walker;Gait belt  OT Visit Diagnosis: Other abnormalities of gait and mobility (R26.89);Pain Pain - Right/Left: Right Pain - part of body: Knee;Leg   Activity Tolerance Patient tolerated treatment well   Patient Left in chair;with call [place]/phone within reach;with chair alarm set   Nurse Communication Patient requests pain meds;Mobility status        Time: 5427-0623 OT Time Calculation (min): 39 min  Charges:  OT General Charges $OT Visit: 1 Visit OT Treatments $Self Care/Home Management : 23-37 mins $Therapeutic Activity: 8-22 mins  Rockney Ghee, M.S., OTR/L Ascom: [phone] 11/23/20, 12:20 PM

## 2020-11-24 LAB — GLUCOSE, CAPILLARY
Glucose-Capillary: 121 mg/dL — ABNORMAL HIGH (ref 70–99)
Glucose-Capillary: 125 mg/dL — ABNORMAL HIGH (ref 70–99)
Glucose-Capillary: 114 mg/dL — ABNORMAL HIGH (ref 70–99)
Glucose-Capillary: 125 mg/dL — ABNORMAL HIGH (ref 70–99)

## 2020-11-24 NOTE — Plan of Care (Signed)

## 2020-11-24 NOTE — Progress Notes (Signed)
PT Cancellation Note  Patient Details Name: Kristi Mccoy MRN: [id number] DOB: [birth year]   Cancelled Treatment:    Reason Eval/Treat Not Completed: Fatigue/lethargy limiting ability to participate;Pain limiting ability to participate   Attempted x 2 this pm.  She had been medicated prior but stated overall increased pain and not able to do therapy at this time.  Returned later in PM and she is sound asleep.  Will resume tomorrow.   Danielle Dess 11/24/2020, 2:29 PM

## 2020-11-24 NOTE — Plan of Care (Signed)
Patient is alert and oriented x 4. Patient has needed prn oxycodone twice during shift.  Patient is voiding well, vital signs are stable. No other complaints at this time. Will continue to monitor.  Problem: Education: Goal: Knowledge of the prescribed therapeutic regimen will improve Outcome: Progressing Goal: Individualized Educational Video(s) Outcome: Progressing   Problem: Activity: Goal: Ability to avoid complications of mobility impairment will improve Outcome: Progressing Goal: Range of joint motion will improve Outcome: Progressing   Problem: Clinical Measurements: Goal: Postoperative complications will be avoided or minimized Outcome: Progressing   Problem: Pain Management: Goal: Pain level will decrease with appropriate interventions Outcome: Progressing   Problem: Skin Integrity: Goal: Will show signs of wound healing Outcome: Progressing   Problem: Education: Goal: Knowledge of General Education information will improve Description: Including pain rating scale, medication(s)/side effects and non-pharmacologic comfort measures Outcome: Progressing

## 2020-11-24 NOTE — Progress Notes (Signed)
Physical Therapy Treatment Patient Details Name: Kristi Mccoy MRN: [id number] DOB: 10/08/1965 Today's Date: 11/24/2020   History of Present Illness Kristi Mccoy is a 55yoF who comes to [facility] on 11/20/20 for elective Left TKA.    PT Comments    Pt ready for session.  Participated in exercises as described below.  Pt able to get to EOB on her own today with self assist of LE.  She is able to stand and with slow step to pattern 90'.  Unable to progress to step through pattern.  She has opted not to wear KI in bed.  Reviewed MD preferences and encouraged use as ordered.  Knee extension is much improved today but flexion remains with limitations.   Recommendations for follow up therapy are one component of a multi-disciplinary discharge planning process, led by the attending physician.  Recommendations may be updated based on patient status, additional functional criteria and insurance authorization.  Follow Up Recommendations  SNF;Supervision for mobility/OOB     Equipment Recommendations  Rolling walker with 5" wheels    Recommendations for Other Services       Precautions / Restrictions Precautions Precautions: Fall;Knee Precaution Booklet Issued: Yes (comment) Required Braces or Orthoses: Knee Immobilizer - Left Knee Immobilizer - Left: Other (comment) (when in bed) Restrictions Weight Bearing Restrictions: Yes LLE Weight Bearing: Weight bearing as tolerated     Mobility  Bed Mobility Overal bed mobility: Needs Assistance Bed Mobility: Supine to Sit     Supine to sit: Min guard          Transfers Overall transfer level: Needs assistance Equipment used: Rolling walker (2 wheeled) Transfers: Sit to/from Stand Sit to Stand: Min guard            Ambulation/Gait Ambulation/Gait assistance: Land (Feet): 90 Feet Assistive device: Rolling walker (2 wheeled) Gait Pattern/deviations: Step-to pattern Gait velocity: decreased       Stairs              Wheelchair Mobility    Modified Rankin (Stroke Patients Only)       Balance Overall balance assessment: Needs assistance Sitting-balance support: Feet supported Sitting balance-Leahy Scale: Good     Standing balance support: During functional activity Standing balance-Leahy Scale: Fair Standing balance comment: Heavy usage of UE on RW during ambulation. No loss of balance noted                            Cognition Arousal/Alertness: Awake/alert Behavior During Therapy: WFL for tasks assessed/performed Overall Cognitive Status: Within Functional Limits for tasks assessed                                        Exercises Total Joint Exercises Ankle Circles/Pumps: AROM;10 reps;Supine;Left Quad Sets: AROM;Supine;15 reps;Left Short Arc Quad: AAROM;Left;Supine;Limitations;15 reps Heel Slides: Seated;Supine;AAROM;15 reps Hip ABduction/ADduction: Supine;Left;AAROM;Limitations;10 reps Straight Leg Raises: AAROM;Left;10 reps Long Arc Quad: AAROM;Left;10 reps Knee Flexion: AAROM;Left;10 reps Goniometric ROM: 4-70    General Comments        Pertinent Vitals/Pain Pain Assessment: Faces Faces Pain Scale: Hurts little more Pain Location: Operative knee Pain Descriptors / Indicators: Guarding;Grimacing;Sore;Throbbing;Tightness Pain Intervention(s): Limited activity within patient's tolerance;Monitored during session;Premedicated before session;Ice applied;Repositioned    Home Living  Prior Function            PT Goals (current goals can now be found in the care plan section) Progress towards PT goals: Progressing toward goals    Frequency    BID      PT Plan Current plan remains appropriate    Co-evaluation              AM-PAC PT "6 Clicks" Mobility   Outcome Measure  Help needed turning from your back to your side while in a flat bed without using bedrails?: A Lot Help needed moving from  lying on your back to sitting on the side of a flat bed without using bedrails?: A Lot Help needed moving to and from a bed to a chair (including a wheelchair)?: A Little Help needed standing up from a chair using your arms (e.g., wheelchair or bedside chair)?: A Little Help needed to walk in hospital room?: A Little Help needed climbing 3-5 steps with a railing? : A Lot 6 Click Score: 15    End of Session Equipment Utilized During Treatment: Gait belt Activity Tolerance: Patient tolerated treatment well Patient left: with call [place]/phone within reach;in chair;with chair alarm set Nurse Communication: Mobility status PT Visit Diagnosis: Difficulty in walking, not elsewhere classified (R26.2);Muscle weakness (generalized) (M62.81);Dizziness and giddiness (R42);Other symptoms and signs involving the nervous system (B93.903)     Time: 0092-3300 PT Time Calculation (min) (ACUTE ONLY): 25 min  Charges:  $Gait Training: 8-22 mins                    Danielle Dess, PTA 11/24/20, 12:46 PM

## 2020-11-25 LAB — GLUCOSE, CAPILLARY
Glucose-Capillary: 122 mg/dL — ABNORMAL HIGH (ref 70–99)
Glucose-Capillary: 105 mg/dL — ABNORMAL HIGH (ref 70–99)
Glucose-Capillary: 87 mg/dL (ref 70–99)
Glucose-Capillary: 115 mg/dL — ABNORMAL HIGH (ref 70–99)
Glucose-Capillary: 105 mg/dL — ABNORMAL HIGH (ref 70–99)

## 2020-11-25 NOTE — Plan of Care (Signed)
  Problem: Education: Goal: Knowledge of the prescribed therapeutic regimen will improve Outcome: Progressing Goal: Individualized Educational Video(s) Outcome: Progressing   Problem: Activity: Goal: Ability to avoid complications of mobility impairment will improve Outcome: Progressing Goal: Range of joint motion will improve Outcome: Progressing   Problem: Clinical Measurements: Goal: Postoperative complications will be avoided or minimized Outcome: Progressing   Problem: Pain Management: Goal: Pain level will decrease with appropriate interventions Outcome: Progressing   Problem: Skin Integrity: Goal: Will show signs of wound healing Outcome: Progressing   Problem: Education: Goal: Knowledge of General Education information will improve Description: Including pain rating scale, medication(s)/side effects and non-pharmacologic comfort measures Outcome: Progressing   Problem: Clinical Measurements: Goal: Ability to maintain clinical measurements within normal limits will improve Outcome: Progressing Goal: Will remain free from infection Outcome: Progressing Goal: Diagnostic test results will improve Outcome: Progressing Goal: Respiratory complications will improve Outcome: Progressing Goal: Cardiovascular complication will be avoided Outcome: Progressing   Problem: Activity: Goal: Risk for activity intolerance will decrease Outcome: Progressing   Problem: Nutrition: Goal: Adequate nutrition will be maintained Outcome: Progressing   Problem: Elimination: Goal: Will not experience complications related to bowel motility Outcome: Progressing Goal: Will not experience complications related to urinary retention Outcome: Progressing   Problem: Pain Managment: Goal: General experience of comfort will improve Outcome: Progressing   Problem: Skin Integrity: Goal: Risk for impaired skin integrity will decrease Outcome: Progressing

## 2020-11-26 LAB — GLUCOSE, CAPILLARY
Glucose-Capillary: 111 mg/dL — ABNORMAL HIGH (ref 70–99)
Glucose-Capillary: 91 mg/dL (ref 70–99)
Glucose-Capillary: 100 mg/dL — ABNORMAL HIGH (ref 70–99)
Glucose-Capillary: 107 mg/dL — ABNORMAL HIGH (ref 70–99)

## 2020-11-26 MED ORDER — ACETAMINOPHEN 325 MG PO TABS
650.0000 mg | ORAL_TABLET | Freq: Four times a day (QID) | ORAL | Status: DC
  Administered 2020-11-26 – 2020-11-30 (×16): 650 mg via ORAL
  Filled 2020-11-26 (×15): qty 2

## 2020-11-26 NOTE — Plan of Care (Signed)
  Problem: Education: Goal: Knowledge of the prescribed therapeutic regimen will improve Outcome: Progressing   Problem: Activity: Goal: Ability to avoid complications of mobility impairment will improve Outcome: Progressing Goal: Range of joint motion will improve Outcome: Progressing   Problem: Clinical Measurements: Goal: Postoperative complications will be avoided or minimized Outcome: Progressing   Problem: Pain Management: Goal: Pain level will decrease with appropriate interventions Outcome: Progressing   Problem: Skin Integrity: Goal: Will show signs of wound healing Outcome: Progressing   Problem: Education: Goal: Knowledge of General Education information will improve Description: Including pain rating scale, medication(s)/side effects and non-pharmacologic comfort measures Outcome: Progressing   Problem: Health Behavior/Discharge Planning: Goal: Ability to manage health-related needs will improve Outcome: Progressing   Problem: Clinical Measurements: Goal: Ability to maintain clinical measurements within normal limits will improve Outcome: Progressing Goal: Will remain free from infection Outcome: Progressing Goal: Diagnostic test results will improve Outcome: Progressing Goal: Respiratory complications will improve Outcome: Progressing Goal: Cardiovascular complication will be avoided Outcome: Progressing   Problem: Activity: Goal: Risk for activity intolerance will decrease Outcome: Progressing   Problem: Nutrition: Goal: Adequate nutrition will be maintained Outcome: Progressing   Problem: Coping: Goal: Level of anxiety will decrease Outcome: Progressing   Problem: Elimination: Goal: Will not experience complications related to bowel motility Outcome: Progressing Goal: Will not experience complications related to urinary retention Outcome: Progressing   Problem: Pain Managment: Goal: General experience of comfort will improve Outcome:  Progressing   Problem: Safety: Goal: Ability to remain free from injury will improve Outcome: Progressing   Problem: Skin Integrity: Goal: Risk for impaired skin integrity will decrease Outcome: Progressing   

## 2020-11-26 NOTE — TOC Progression Note (Signed)
Transition of Care [facility]) - Progression Note    Patient Details  Name: Kristi Mccoy MRN: [id number] Date of Birth: [birth year]  Transition of Care [facility]) CM/SW Contact  Barrie Dunker, RN Phone Number: 11/26/2020, 10:35 AM  Clinical Narrative:    [employer] is going to be checking on Auth that is pending, she will contact me and let me know the status        Expected Discharge Plan and Services                                                 Social Determinants of Health (SDOH) Interventions    Readmission Risk Interventions No flowsheet data found.

## 2020-11-26 NOTE — Progress Notes (Signed)
Physical Therapy Treatment Patient Details Name: Kristi Mccoy MRN: [id number] DOB: 09-Dec-1965 Today's Date: 11/26/2020   History of Present Illness Kristi Mccoy is a 55yoF who comes to [facility] on 11/20/20 for elective Left TKA.    PT Comments    Pt ready for session.  Participated in exercises as described below.  She is able to progress gait with improving step through pattern but needs encouragement to attempt.  She self selects and increased distance today.  She continues to need assist to lift LE into bed but overall is making daily gains.  SNF remains appropriate as ROM remains limited, strength, poor support at home and further education is needed.   Recommendations for follow up therapy are one component of a multi-disciplinary discharge planning process, led by the attending physician.  Recommendations may be updated based on patient status, additional functional criteria and insurance authorization.  Follow Up Recommendations  SNF;Supervision for mobility/OOB     Equipment Recommendations  Rolling walker with 5" wheels    Recommendations for Other Services       Precautions / Restrictions Precautions Precautions: Fall;Knee Precaution Booklet Issued: Yes (comment) Required Braces or Orthoses: Knee Immobilizer - Left Knee Immobilizer - Left: Other (comment) (when in bed) Restrictions Weight Bearing Restrictions: Yes LLE Weight Bearing: Weight bearing as tolerated     Mobility  Bed Mobility Overal bed mobility: Needs Assistance Bed Mobility: Supine to Sit     Supine to sit: Supervision Sit to supine: Min assist   General bed mobility comments: assist for LE    Transfers Overall transfer level: Needs assistance Equipment used: Rolling walker (2 wheeled) Transfers: Sit to/from Stand Sit to Stand: Min guard Stand pivot transfers: Min guard          Ambulation/Gait Ambulation/Gait assistance: Min guard Gait Distance (Feet): 120 Feet Assistive device: Rolling  walker (2 wheeled) Gait Pattern/deviations: Step-through pattern;Step-to pattern Gait velocity: decreased   General Gait Details: able to progress to step through pattern with cues - no buckling noted   Stairs             Wheelchair Mobility    Modified Rankin (Stroke Patients Only)       Balance Overall balance assessment: Needs assistance Sitting-balance support: Feet supported Sitting balance-Leahy Scale: Good     Standing balance support: Bilateral upper extremity supported Standing balance-Leahy Scale: Fair Standing balance comment: Less usage of UE on RW during ambulation. No loss of balance noted                            Cognition Arousal/Alertness: Awake/alert Behavior During Therapy: WFL for tasks assessed/performed Overall Cognitive Status: Within Functional Limits for tasks assessed                                        Exercises Total Joint Exercises Heel Slides: Seated;Supine;AAROM;15 reps Hip ABduction/ADduction: Supine;Left;AAROM;Limitations;10 reps Straight Leg Raises: AAROM;Left;10 reps Long Arc Quad: AAROM;Left;10 reps Knee Flexion: AAROM;Left;10 reps Goniometric ROM: 2-74 - increased today but remains limited    General Comments        Pertinent Vitals/Pain Pain Assessment: Faces Faces Pain Scale: Hurts a little bit Pain Location: Operative knee Pain Descriptors / Indicators: Guarding;Grimacing;Sore;Throbbing;Tightness Pain Intervention(s): Limited activity within patient's tolerance;Monitored during session;Premedicated before session    Home Living  Prior Function            PT Goals (current goals can now be found in the care plan section) Progress towards PT goals: Progressing toward goals    Frequency    BID      PT Plan Current plan remains appropriate    Co-evaluation              AM-PAC PT "6 Clicks" Mobility   Outcome Measure  Help needed  turning from your back to your side while in a flat bed without using bedrails?: None Help needed moving from lying on your back to sitting on the side of a flat bed without using bedrails?: None Help needed moving to and from a bed to a chair (including a wheelchair)?: A Little Help needed standing up from a chair using your arms (e.g., wheelchair or bedside chair)?: A Little Help needed to walk in hospital room?: A Little Help needed climbing 3-5 steps with a railing? : A Lot 6 Click Score: 19    End of Session Equipment Utilized During Treatment: Gait belt Activity Tolerance: Patient tolerated treatment well Patient left: with call [place]/phone within reach;in bed;with bed alarm set Nurse Communication: Mobility status PT Visit Diagnosis: Difficulty in walking, not elsewhere classified (R26.2);Muscle weakness (generalized) (M62.81);Dizziness and giddiness (R42);Other symptoms and signs involving the nervous system (R29.898)     Time: 0093-8182 PT Time Calculation (min) (ACUTE ONLY): 28 min  Charges:  $Gait Training: 8-22 mins $Therapeutic Exercise: 8-22 mins                    Danielle Dess, PTA 11/26/20, 12:40 PM

## 2020-11-26 NOTE — Progress Notes (Signed)
Physical Therapy Treatment Patient Details Name: Kristi Mccoy MRN: [id number] DOB: Jul 23, 1965 Today's Date: 11/26/2020   History of Present Illness Kristi Mccoy is a 55yoF who comes to [facility] on 11/20/20 for elective Left TKA.    PT Comments    Late entry for 9/18  Ready for session.  Pre-medicated.  Participated in exercises as described below.  In and out of bed with supervision.  She is able to progress gait to step-through pattern today with education and time.  Overall does well but knee flexion remain quite limited.  She will benefit from SNF for increased therapy for knee ROM and strength along with education for improved outcomes.    Recommendations for follow up therapy are one component of a multi-disciplinary discharge planning process, led by the attending physician.  Recommendations may be updated based on patient status, additional functional criteria and insurance authorization.  Follow Up Recommendations  SNF;Supervision for mobility/OOB     Equipment Recommendations  Rolling walker with 5" wheels    Recommendations for Other Services       Precautions / Restrictions Precautions Precautions: Fall;Knee Precaution Booklet Issued: Yes (comment) Required Braces or Orthoses: Knee Immobilizer - Left Knee Immobilizer - Left: Other (comment) (when in bed) Restrictions Weight Bearing Restrictions: Yes LLE Weight Bearing: Weight bearing as tolerated     Mobility  Bed Mobility Overal bed mobility: Needs Assistance Bed Mobility: Supine to Sit     Supine to sit: Supervision Sit to supine: Supervision   General bed mobility comments: elects to return to bed vs in recliner    Transfers Overall transfer level: Needs assistance Equipment used: Rolling walker (2 wheeled) Transfers: Sit to/from Stand   Stand pivot transfers: Min guard          Ambulation/Gait Ambulation/Gait assistance: Min guard Gait Distance (Feet): 100 Feet Assistive device: Rolling walker (2  wheeled) Gait Pattern/deviations: Step-through pattern;Step-to pattern Gait velocity: decreased   General Gait Details: able to progress to step through pattern with cues - no buckling noted   Stairs             Wheelchair Mobility    Modified Rankin (Stroke Patients Only)       Balance Overall balance assessment: Needs assistance Sitting-balance support: Feet supported Sitting balance-Leahy Scale: Good     Standing balance support: Bilateral upper extremity supported Standing balance-Leahy Scale: Fair Standing balance comment: Heavy usage of UE on RW during ambulation. No loss of balance noted                            Cognition Arousal/Alertness: Awake/alert Behavior During Therapy: WFL for tasks assessed/performed Overall Cognitive Status: Within Functional Limits for tasks assessed                                        Exercises Total Joint Exercises Heel Slides: Seated;Supine;AAROM;15 reps Hip ABduction/ADduction: Supine;Left;AAROM;Limitations;10 reps Straight Leg Raises: AAROM;Left;10 reps Long Arc Quad: AAROM;Left;10 reps Knee Flexion: AAROM;Left;10 reps Goniometric ROM: 2-70 remains quite limited    General Comments        Pertinent Vitals/Pain Pain Assessment: Faces Faces Pain Scale: Hurts little more Pain Location: Operative knee Pain Descriptors / Indicators: Guarding;Grimacing;Sore;Throbbing;Tightness Pain Intervention(s): Limited activity within patient's tolerance;Monitored during session;Premedicated before session    Home Living  Prior Function            PT Goals (current goals can now be found in the care plan section) Progress towards PT goals: Progressing toward goals    Frequency    BID      PT Plan Current plan remains appropriate    Co-evaluation              AM-PAC PT "6 Clicks" Mobility   Outcome Measure  Help needed turning from your back to  your side while in a flat bed without using bedrails?: None Help needed moving from lying on your back to sitting on the side of a flat bed without using bedrails?: None Help needed moving to and from a bed to a chair (including a wheelchair)?: A Little Help needed standing up from a chair using your arms (e.g., wheelchair or bedside chair)?: A Little Help needed to walk in hospital room?: A Little Help needed climbing 3-5 steps with a railing? : A Lot 6 Click Score: 19    End of Session Equipment Utilized During Treatment: Gait belt Activity Tolerance: Patient tolerated treatment well Patient left: with call [employer]/phone within reach;in bed;with bed alarm set Nurse Communication: Mobility status PT Visit Diagnosis: Difficulty in walking, not elsewhere classified (R26.2);Muscle weakness (generalized) (M62.81);Dizziness and giddiness (R42);Other symptoms and signs involving the nervous system (R29.898)     Time: 1000-1030 PT Time Calculation (min) (ACUTE ONLY): 30 min  Charges:  $Gait Training: 8-22 mins $Therapeutic Exercise: 8-22 mins                     {Lexus Barletta, PTA 11/26/20, 8:42 AM

## 2020-11-26 NOTE — TOC Progression Note (Signed)
Transition of Care [facility]) - Progression Note    Patient Details  Name: Kristi Mccoy MRN: [id number] Date of Birth: 05/15/1965  Transition of Care [facility]) CM/SW Contact  Barrie Dunker, RN Phone Number: 11/26/2020, 3:47 PM  Clinical Narrative:     Called to follow up on insurance auth, still pending       Expected Discharge Plan and Services                                                 Social Determinants of Health (SDOH) Interventions    Readmission Risk Interventions No flowsheet data found.

## 2020-11-26 NOTE — Progress Notes (Signed)
  Subjective:  Patient seen at 1230 hours.  POD #6 s/p left TKA.   Patient reports left knee pain as mild to moderate, still having discomfort at rest.   Pain is improving.  Making progress with PT.  Had BM 9/18.    Objective:   VITALS:   Vitals:   11/25/20 1950 11/26/20 0500 11/26/20 0735 11/26/20 1132  BP: (!) 144/71 (!) 146/86 133/78 124/72  Pulse: 95 88 90 85  Resp: 17 18 18 17   Temp: 98.9 F (37.2 C) 98.7 F (37.1 C) 98 F (36.7 C) 98 F (36.7 C)  TempSrc: Oral Oral    SpO2: 100% 97% 98% 96%  Weight:      Height:        PHYSICAL EXAM: Left lower extremity Neurovascular intact Sensation intact distally Intact pulses distally Dorsiflexion/Plantar flexion intact Incision: no drainage No cellulitis present Compartment soft  LABS  Results for orders placed or performed during the hospital encounter of 11/20/20 (from the past 24 hour(s))  Glucose, capillary     Status: None   Collection Time: 11/25/20  5:09 PM  Result Value Ref Range   Glucose-Capillary 87 70 - 99 mg/dL  Glucose, capillary     Status: Abnormal   Collection Time: 11/25/20  7:52 PM  Result Value Ref Range   Glucose-Capillary 115 (H) 70 - 99 mg/dL   Comment 1 Notify RN    Comment 2 Document in Chart   Glucose, capillary     Status: Abnormal   Collection Time: 11/25/20  9:30 PM  Result Value Ref Range   Glucose-Capillary 105 (H) 70 - 99 mg/dL  Glucose, capillary     Status: Abnormal   Collection Time: 11/26/20  7:36 AM  Result Value Ref Range   Glucose-Capillary 111 (H) 70 - 99 mg/dL  Glucose, capillary     Status: None   Collection Time: 11/26/20 11:33 AM  Result Value Ref Range   Glucose-Capillary 91 70 - 99 mg/dL    No results found.  Assessment/Plan: 6 Days Post-Op   Active Problems:   S/P TKR (total knee replacement) using cement, left  Continue PT.  Add scheduled Tylenol to pain plan.  Patient awaiting insurance approval for SNF.      11/28/20 ,PA-C 11/26/2020, 1:09  PM

## 2020-11-26 NOTE — Progress Notes (Signed)
Physical Therapy Treatment Patient Details Name: Kristi Mccoy MRN: [id number] DOB: [birth year] Today's Date: 11/26/2020   History of Present Illness Kristi Mccoy is a 55yoF who comes to [facility] on 11/20/20 for elective Left TKA.    PT Comments    Completed lap this PM around unit with step through gait pattern.  Mobility progressing well but ROM remains limited.   Recommendations for follow up therapy are one component of a multi-disciplinary discharge planning process, led by the attending physician.  Recommendations may be updated based on patient status, additional functional criteria and insurance authorization.  Follow Up Recommendations  SNF;Supervision for mobility/OOB     Equipment Recommendations  Rolling walker with 5" wheels    Recommendations for Other Services       Precautions / Restrictions Precautions Precautions: Fall;Knee Precaution Booklet Issued: Yes (comment) Required Braces or Orthoses: Knee Immobilizer - Left Knee Immobilizer - Left: Other (comment) (when in bed) Restrictions Weight Bearing Restrictions: Yes LLE Weight Bearing: Weight bearing as tolerated     Mobility  Bed Mobility Overal bed mobility: Needs Assistance Bed Mobility: Supine to Sit;Sit to Supine     Supine to sit: Supervision Sit to supine: Min assist   General bed mobility comments: assist for LE    Transfers Overall transfer level: Needs assistance Equipment used: Rolling walker (2 wheeled) Transfers: Sit to/from Stand Sit to Stand: Min guard Stand pivot transfers: Min guard          Ambulation/Gait Ambulation/Gait assistance: Min guard Gait Distance (Feet): 200 Feet Assistive device: Rolling walker (2 wheeled) Gait Pattern/deviations: Step-through pattern;Step-to pattern Gait velocity: decreased   General Gait Details: able to progress to step through pattern with cues - no buckling noted   Stairs             Wheelchair Mobility    Modified Rankin (Stroke  Patients Only)       Balance Overall balance assessment: Needs assistance Sitting-balance support: Feet supported Sitting balance-Leahy Scale: Good     Standing balance support: Bilateral upper extremity supported Standing balance-Leahy Scale: Fair Standing balance comment: Less usage of UE on RW during ambulation. No loss of balance noted                            Cognition Arousal/Alertness: Awake/alert Behavior During Therapy: WFL for tasks assessed/performed Overall Cognitive Status: Within Functional Limits for tasks assessed                                        Exercises Total Joint Exercises Heel Slides: Seated;Supine;AAROM;15 reps Hip ABduction/ADduction: Supine;Left;AAROM;Limitations;10 reps Straight Leg Raises: AAROM;Left;10 reps Long Arc Quad: AAROM;Left;10 reps Knee Flexion: AAROM;Left;10 reps Goniometric ROM: 2-74 - increased today but remains limited    General Comments        Pertinent Vitals/Pain Pain Assessment: Faces Faces Pain Scale: Hurts little more Pain Location: Operative knee Pain Descriptors / Indicators: Guarding;Grimacing;Sore;Throbbing;Tightness Pain Intervention(s): Limited activity within patient's tolerance;Monitored during session;Premedicated before session    Home Living                      Prior Function            PT Goals (current goals can now be found in the care plan section) Progress towards PT goals: Progressing toward goals    Frequency  BID      PT Plan Current plan remains appropriate    Co-evaluation              AM-PAC PT "6 Clicks" Mobility   Outcome Measure  Help needed turning from your back to your side while in a flat bed without using bedrails?: None Help needed moving from lying on your back to sitting on the side of a flat bed without using bedrails?: None Help needed moving to and from a bed to a chair (including a wheelchair)?: A Little Help  needed standing up from a chair using your arms (e.g., wheelchair or bedside chair)?: A Little Help needed to walk in hospital room?: A Little Help needed climbing 3-5 steps with a railing? : A Lot 6 Click Score: 19    End of Session Equipment Utilized During Treatment: Gait belt Activity Tolerance: Patient tolerated treatment well Patient left: with call [place]/phone within reach;in bed;with bed alarm set Nurse Communication: Mobility status PT Visit Diagnosis: Difficulty in walking, not elsewhere classified (R26.2);Muscle weakness (generalized) (M62.81);Dizziness and giddiness (R42);Other symptoms and signs involving the nervous system (L87.564)     Time: 3329-5188 PT Time Calculation (min) (ACUTE ONLY): 17 min  Charges:  $Gait Training: 8-22 mins $Therapeutic Exercise: 8-22 mins                    Danielle Dess, PTA 11/26/20, 3:55 PM

## 2020-11-27 LAB — CREATININE, SERUM
Creatinine, Ser: 0.54 mg/dL (ref 0.44–1.00)
GFR, Estimated: >60 mL/min (ref >60)

## 2020-11-27 LAB — GLUCOSE, CAPILLARY
Glucose-Capillary: 105 mg/dL — ABNORMAL HIGH (ref 70–99)
Glucose-Capillary: 78 mg/dL (ref 70–99)
Glucose-Capillary: 111 mg/dL — ABNORMAL HIGH (ref 70–99)
Glucose-Capillary: 126 mg/dL — ABNORMAL HIGH (ref 70–99)
Glucose-Capillary: 145 mg/dL — ABNORMAL HIGH (ref 70–99)

## 2020-11-27 NOTE — Progress Notes (Signed)
Occupational Therapy Treatment Patient Details Name: Kristi Mccoy MRN: [id number] DOB: 1965-08-03 Today's Date: 11/27/2020   History of present illness Kristi Mccoy is a 55yoF who comes to [facility] on 11/20/20 for elective Left TKA.   OT comments  Kristi Mccoy was seen for OT tx session this date. Therapist coordinated with nsg prior to session for pt pain medication at pt request. Pt endorses 6/10 pain at start of session (after receiving medication), but is agreeable to OT tx session. She is able to return demonstrate understanding of safe use of Sock Aide, LH reacher, and RW during session. She requires MIN cueing for technique t/o LB dressing task. She requires increased time/effort to perform tasks 2/2 limited LLE ROM and increased pain with activity. See ADL section below for additional details. Pt is making good progress toward OT goals and continues to benefit from skilled OT services. Will continue to follow POC as written. STR remains most appropriate DC disposition.       Recommendations for follow up therapy are one component of a multi-disciplinary discharge planning process, led by the attending physician.  Recommendations may be updated based on patient status, additional functional criteria and insurance authorization.    Follow Up Recommendations  SNF    Equipment Recommendations  3 in 1 bedside commode    Recommendations for Other Services      Precautions / Restrictions Precautions Precautions: Fall;Knee Precaution Booklet Issued: Yes (comment) Required Braces or Orthoses: Knee Immobilizer - Left Knee Immobilizer - Left: Other (comment) (When in bed.) Restrictions Weight Bearing Restrictions: Yes LLE Weight Bearing: Weight bearing as tolerated       Mobility Bed Mobility Overal bed mobility: Needs Assistance Bed Mobility: Sit to Supine     Supine to sit: Supervision Sit to supine: Min assist   General bed mobility comments: Increased time/effort to perform c HOB  elevated    Transfers Overall transfer level: Needs assistance Equipment used: Rolling walker (2 wheeled) Transfers: Sit to/from Stand Sit to Stand: Supervision         General transfer comment: CGA due to increased pain during transfer    Balance Overall balance assessment: Needs assistance Sitting-balance support: Feet supported Sitting balance-Leahy Scale: Good Sitting balance - Comments: Steady static sitting, reaching within BOS. No LOB appreciated with weight shift during LBD task.   Standing balance support: Bilateral upper extremity supported Standing balance-Leahy Scale: Good Standing balance comment: Less usage of UE on RW during ambulation. No loss of balance noted                           ADL either performed or assessed with clinical judgement   ADL Overall ADL's : Needs assistance/impaired                                       General ADL Comments: Pt continues to be limited by decreased activity tolerance, increased pain and decreased AROM of her LLE. She continues to require close supervision for bed/functional mobility. Therapist facilitates reinforcement of prior education on safe use of AE/DME for ADL management including safe use of: 3 in 1, LH reacher, & Sock aide this date. She is able to doff/don sock on her operative extremity with min cueing for technique and supervision for safety. Pt continues to require MIN-MOD A for more complex LB ADL management.     Vision  Patient Visual Report: No change from baseline     Perception     Praxis      Cognition Arousal/Alertness: Awake/alert Behavior During Therapy: WFL for tasks assessed/performed Overall Cognitive Status: Within Functional Limits for tasks assessed                                          Exercises Other Exercises Other Exercises: OT facilitates Bed/functional mobility, LB dressing task, reinforcement of past education on falls prevention,  safe use of AE/DME provided t/o session. See ADL section for additional details.   Shoulder Instructions       General Comments      Pertinent Vitals/ Pain       Pain Assessment: No/denies pain Pain Score: 7  Faces Pain Scale: Hurts a little bit Pain Location: Operative knee with mobility Pain Descriptors / Indicators: Guarding;Grimacing;Sore;Throbbing;Tightness Pain Intervention(s): Limited activity within patient's tolerance;Monitored during session;Premedicated before session;Repositioned  Home Living                                          Prior Functioning/Environment              Frequency  Min 2X/week        Progress Toward Goals  OT Goals(current goals can now be found in the care plan section)  Progress towards OT goals: Progressing toward goals  Acute Rehab OT Goals Patient Stated Goal: rehab then home OT Goal Formulation: With patient Time For Goal Achievement: 12/05/20 Potential to Achieve Goals: Good  Plan Discharge plan remains appropriate;Frequency remains appropriate    Co-evaluation                 AM-PAC OT "6 Clicks" Daily Activity     Outcome Measure   Help from another person eating meals?: A Little Help from another person taking care of personal grooming?: A Little Help from another person toileting, which includes using toliet, bedpan, or urinal?: A Little Help from another person bathing (including washing, rinsing, drying)?: A Little Help from another person to put on and taking off regular upper body clothing?: A Little Help from another person to put on and taking off regular lower body clothing?: A Little 6 Click Score: 18    End of Session Equipment Utilized During Treatment: Rolling walker;Gait belt  OT Visit Diagnosis: Other abnormalities of gait and mobility (R26.89);Pain Pain - Right/Left: Left Pain - part of body: Knee;Leg   Activity Tolerance Patient tolerated treatment well   Patient Left   (Seated EOB with PT in to begin session.)   Nurse Communication          Time: 2778-2423 OT Time Calculation (min): 24 min  Charges: OT General Charges $OT Visit: 1 Visit OT Treatments $Self Care/Home Management : 23-37 mins  Rockney Ghee, M.S., OTR/L Ascom: [phone] 11/27/20, 3:50 PM

## 2020-11-27 NOTE — TOC Progression Note (Signed)
Transition of Care [facility]) - Progression Note    Patient Details  Name: Kristi Mccoy MRN: [id number] Date of Birth: [birth year]  [facility] of [facility] CM/SW Contact  Barrie Dunker, RN Phone Number: 11/27/2020, 9:32 AM  Clinical Narrative:    Reached out to [employer], they have called to get update status on Insurance, It has went for medical director review, awaiting apporval        Expected Discharge Plan and Services                                                 Social Determinants of Health (SDOH) Interventions    Readmission Risk Interventions No flowsheet data found.

## 2020-11-27 NOTE — Progress Notes (Signed)
  Subjective:  Patient seen at 0730 hours.  POD #7 s/p left TKA.   Patient reports left knee pain as mild to moderate.   Pain is improving.  Making progress with PT.      Objective:   VITALS:   Vitals:   11/26/20 0735 11/26/20 1132 11/26/20 1607 11/26/20 2158  BP: 133/78 124/72 (!) 142/78 (!) 141/76  Pulse: 90 85 87 86  Resp: 18 17 18 17   Temp: 98 F (36.7 C) 98 F (36.7 C) 98.8 F (37.1 C) 98 F (36.7 C)  TempSrc:   Oral   SpO2: 98% 96% 98% 97%  Weight:      Height:        PHYSICAL EXAM: Left lower extremity Neurovascular intact Sensation intact distally Intact pulses distally Dorsiflexion/Plantar flexion intact Incision: no drainage No cellulitis present Compartment soft  LABS  Results for orders placed or performed during the hospital encounter of 11/20/20 (from the past 24 hour(s))  Glucose, capillary     Status: None   Collection Time: 11/26/20 11:33 AM  Result Value Ref Range   Glucose-Capillary 91 70 - 99 mg/dL  Glucose, capillary     Status: Abnormal   Collection Time: 11/26/20  4:06 PM  Result Value Ref Range   Glucose-Capillary 100 (H) 70 - 99 mg/dL  Glucose, capillary     Status: Abnormal   Collection Time: 11/26/20  9:59 PM  Result Value Ref Range   Glucose-Capillary 107 (H) 70 - 99 mg/dL   Comment 1 Notify RN   Creatinine, serum     Status: None   Collection Time: 11/27/20  4:38 AM  Result Value Ref Range   Creatinine, Ser 0.54 0.44 - 1.00 mg/dL   GFR, Estimated 11/29/20 >16 mL/min    No results found.  Assessment/Plan: 7 Days Post-Op   Active Problems:   S/P TKR (total knee replacement) using cement, left  Continue PT.  Add scheduled Tylenol to pain plan. Encourage patient to wean to 1-2 tablets of oxycodone Q4 hours PRN.  Patient awaiting insurance approval for SNF.     >96 ,PA-C 11/27/2020, 8:07 AM

## 2020-11-27 NOTE — Progress Notes (Signed)
Physical Therapy Treatment Patient Details Name: Kristi Mccoy MRN: [id number] DOB: [birth year] Today's Date: 11/27/2020   History of Present Illness Kristi Mccoy is a 55yoF who comes to [facility] on 11/20/20 for elective Left TKA.    PT Comments    Pt assisted on and off commode.  Participated in exercises as described below.  She is able to walk to/from PT gym for stair training.  Needs mod cues for sequencing.    Pt continues to progress with mobility but ROM remains primary barrier to a successful discharge and outcome.  She self limits knee flexion due to pain and anxiety with movement.  Pt has poor support system at home reports husband drinks in excess and would not be able or willing to assist her at home.  She does not self initiate ROM activities that will sufficiently increase ROM on her own.  She will benefit from SNF for encouraged PT/OT services at this time.   Recommendations for follow up therapy are one component of a multi-disciplinary discharge planning process, led by the attending physician.  Recommendations may be updated based on patient status, additional functional criteria and insurance authorization.  Follow Up Recommendations  SNF;Supervision for mobility/OOB     Equipment Recommendations  Rolling walker with 5" wheels    Recommendations for Other Services       Precautions / Restrictions Precautions Precautions: Fall;Knee Precaution Booklet Issued: Yes (comment) Required Braces or Orthoses: Knee Immobilizer - Left Knee Immobilizer - Left: Other (comment) (when in bed) Restrictions Weight Bearing Restrictions: Yes LLE Weight Bearing: Weight bearing as tolerated     Mobility  Bed Mobility Overal bed mobility: Needs Assistance Bed Mobility: Supine to Sit;Sit to Supine     Supine to sit: Supervision Sit to supine: Min assist   General bed mobility comments: assist for LE but strength improving daily.  uanble to lift leg on her own    Transfers Overall  transfer level: Needs assistance Equipment used: Rolling walker (2 wheeled) Transfers: Sit to/from Stand Sit to Stand: Supervision            Ambulation/Gait Ambulation/Gait assistance: Min guard Gait Distance (Feet): 150 Feet Assistive device: Rolling walker (2 wheeled) Gait Pattern/deviations: Step-through pattern Gait velocity: decreased       Stairs Stairs: Yes Stairs assistance: Min assist;Min guard Stair Management: Two rails;Step to pattern Number of Stairs: 4     Wheelchair Mobility    Modified Rankin (Stroke Patients Only)       Balance Overall balance assessment: Needs assistance Sitting-balance support: Feet supported Sitting balance-Leahy Scale: Good     Standing balance support: Bilateral upper extremity supported Standing balance-Leahy Scale: Good Standing balance comment: Less usage of UE on RW during ambulation. No loss of balance noted                            Cognition Arousal/Alertness: Awake/alert Behavior During Therapy: WFL for tasks assessed/performed Overall Cognitive Status: Within Functional Limits for tasks assessed                                        Exercises Total Joint Exercises Heel Slides: Seated;Supine;AAROM;15 reps Hip ABduction/ADduction: Supine;Left;AAROM;Limitations;10 reps Straight Leg Raises: AAROM;Left;10 reps Long Arc Quad: AAROM;Left;10 reps Knee Flexion: AAROM;Left;10 reps Goniometric ROM: 2-65 - self limiting today    General Comments  Pertinent Vitals/Pain Pain Assessment: Faces Faces Pain Scale: Hurts little more Pain Location: Operative knee Pain Descriptors / Indicators: Guarding;Grimacing;Sore;Throbbing;Tightness Pain Intervention(s): Limited activity within patient's tolerance;Monitored during session;Premedicated before session;Repositioned;Ice applied    Home Living                      Prior Function            PT Goals (current goals can  now be found in the care plan section) Progress towards PT goals: Progressing toward goals    Frequency    BID      PT Plan Current plan remains appropriate    Co-evaluation              AM-PAC PT "6 Clicks" Mobility   Outcome Measure  Help needed turning from your back to your side while in a flat bed without using bedrails?: None Help needed moving from lying on your back to sitting on the side of a flat bed without using bedrails?: None Help needed moving to and from a bed to a chair (including a wheelchair)?: A Little Help needed standing up from a chair using your arms (e.g., wheelchair or bedside chair)?: A Little Help needed to walk in hospital room?: A Little Help needed climbing 3-5 steps with a railing? : A Little 6 Click Score: 20    End of Session Equipment Utilized During Treatment: Gait belt Activity Tolerance: Patient tolerated treatment well Patient left: with call Chaudhari/phone within reach;in bed;with bed alarm set Nurse Communication: Mobility status PT Visit Diagnosis: Difficulty in walking, not elsewhere classified (R26.2);Muscle weakness (generalized) (M62.81);Dizziness and giddiness (R42);Other symptoms and signs involving the nervous system (R29.898)     Time: 1610-9604 PT Time Calculation (min) (ACUTE ONLY): 24 min  Charges:  $Therapeutic Exercise: 8-22 mins                    Danielle Dess, PTA 11/27/20, 10:39 AM

## 2020-11-27 NOTE — Progress Notes (Addendum)
Physical Therapy Treatment Patient Details Name: Shakeerah Gradel MRN: [id number] DOB: [birth year] Today's Date: 11/27/2020   History of Present Illness Klover Priestly is a 55yoF who comes to [facility] on 11/20/20 for elective Left TKA.    PT Comments    Pt ready for session.  Finished with OT at EOB.  She si able to complete lap with RW and min guard.  Cues for walker position.  She is instructed in self stretching techniques at EOB and she does respond well to having control over her flexion.  She is encouraged to self stretch each time she gets in/out of bed with staff and voices understanding.  Will continue with stair training tomorrow and re-enforcing HEP and self stretching.    Recommendations for follow up therapy are one component of a multi-disciplinary discharge planning process, led by the attending physician.  Recommendations may be updated based on patient status, additional functional criteria and insurance authorization.  Follow Up Recommendations  SNF     Equipment Recommendations  Rolling walker with 5" wheels    Recommendations for Other Services       Precautions / Restrictions Precautions Precautions: Fall;Knee Precaution Booklet Issued: Yes (comment) Required Braces or Orthoses: Knee Immobilizer - Left Knee Immobilizer - Left: Other (comment) (when in bed) Restrictions Weight Bearing Restrictions: Yes LLE Weight Bearing: Weight bearing as tolerated     Mobility  Bed Mobility Overal bed mobility: Needs Assistance Bed Mobility: Sit to Supine       Sit to supine: Min assist   General bed mobility comments: assist for LE but strength improving daily.  uanble to lift leg on her own    Transfers Overall transfer level: Needs assistance Equipment used: Rolling walker (2 wheeled) Transfers: Sit to/from Stand Sit to Stand: Supervision         General transfer comment: CGA due to increased pain during transfer  Ambulation/Gait Ambulation/Gait assistance: Min  guard Gait Distance (Feet): 160 Feet Assistive device: Rolling walker (2 wheeled) Gait Pattern/deviations: Step-through pattern Gait velocity: decreased   General Gait Details: able to progress to step through pattern with cues - no buckling noted   Stairs             Wheelchair Mobility    Modified Rankin (Stroke Patients Only)       Balance Overall balance assessment: Needs assistance Sitting-balance support: Feet supported Sitting balance-Leahy Scale: Good     Standing balance support: Bilateral upper extremity supported Standing balance-Leahy Scale: Good Standing balance comment: Less usage of UE on RW during ambulation. No loss of balance noted                            Cognition Arousal/Alertness: Awake/alert Behavior During Therapy: WFL for tasks assessed/performed Overall Cognitive Status: Within Functional Limits for tasks assessed                                        Exercises Other Exercises Other Exercises: taught self assist stretching technique in sitting.  she did well with having control and encouraged to do each time she gets in/out of bed with nursing staff    General Comments        Pertinent Vitals/Pain Pain Assessment: Faces Faces Pain Scale: Hurts a little bit Pain Location: Operative knee Pain Descriptors / Indicators: Guarding;Grimacing;Sore;Throbbing;Tightness Pain Intervention(s): Limited activity within patient's tolerance;Monitored  during session;Premedicated before session;Ice applied;Repositioned    Home Living                      Prior Function            PT Goals (current goals can now be found in the care plan section) Progress towards PT goals: Progressing toward goals    Frequency    BID      PT Plan Current plan remains appropriate    Co-evaluation              AM-PAC PT "6 Clicks" Mobility   Outcome Measure  Help needed turning from your back to your side  while in a flat bed without using bedrails?: None Help needed moving from lying on your back to sitting on the side of a flat bed without using bedrails?: None Help needed moving to and from a bed to a chair (including a wheelchair)?: A Little Help needed standing up from a chair using your arms (e.g., wheelchair or bedside chair)?: A Little Help needed to walk in hospital room?: A Little Help needed climbing 3-5 steps with a railing? : A Little 6 Click Score: 20    End of Session Equipment Utilized During Treatment: Gait belt Activity Tolerance: Patient tolerated treatment well Patient left: with call Matte/phone within reach;in bed;with bed alarm set Nurse Communication: Mobility status PT Visit Diagnosis: Difficulty in walking, not elsewhere classified (R26.2);Muscle weakness (generalized) (M62.81);Dizziness and giddiness (R42);Other symptoms and signs involving the nervous system (M38.466)     Time: 0225-0237 PT Time Calculation (min) (ACUTE ONLY): 12 min  Charges:  $Gait Training: 8-22 mins                    Danielle Dess, PTA 11/27/20, 2:46 PM

## 2020-11-27 NOTE — Plan of Care (Signed)
  Problem: Education: Goal: Knowledge of the prescribed therapeutic regimen will improve Outcome: Progressing Goal: Individualized Educational Video(s) Outcome: Progressing   Problem: Activity: Goal: Ability to avoid complications of mobility impairment will improve Outcome: Progressing Goal: Range of joint motion will improve Outcome: Progressing   Problem: Clinical Measurements: Goal: Postoperative complications will be avoided or minimized Outcome: Progressing   Problem: Pain Management: Goal: Pain level will decrease with appropriate interventions Outcome: Progressing   Problem: Skin Integrity: Goal: Will show signs of wound healing Outcome: Progressing   Problem: Education: Goal: Knowledge of General Education information will improve Description: Including pain rating scale, medication(s)/side effects and non-pharmacologic comfort measures Outcome: Progressing   Problem: Health Behavior/Discharge Planning: Goal: Ability to manage health-related needs will improve Outcome: Progressing   Problem: Clinical Measurements: Goal: Ability to maintain clinical measurements within normal limits will improve Outcome: Progressing Goal: Will remain free from infection Outcome: Progressing Goal: Diagnostic test results will improve Outcome: Progressing Goal: Respiratory complications will improve Outcome: Progressing Goal: Cardiovascular complication will be avoided Outcome: Progressing   Problem: Nutrition: Goal: Adequate nutrition will be maintained Outcome: Progressing   Problem: Activity: Goal: Risk for activity intolerance will decrease Outcome: Progressing   Problem: Elimination: Goal: Will not experience complications related to bowel motility Outcome: Progressing Goal: Will not experience complications related to urinary retention Outcome: Progressing   Problem: Safety: Goal: Ability to remain free from injury will improve Outcome: Progressing   Problem:  Skin Integrity: Goal: Risk for impaired skin integrity will decrease Outcome: Progressing

## 2020-11-28 LAB — GLUCOSE, CAPILLARY
Glucose-Capillary: 116 mg/dL — ABNORMAL HIGH (ref 70–99)
Glucose-Capillary: 77 mg/dL (ref 70–99)
Glucose-Capillary: 113 mg/dL — ABNORMAL HIGH (ref 70–99)
Glucose-Capillary: 130 mg/dL — ABNORMAL HIGH (ref 70–99)

## 2020-11-28 MED ORDER — ENOXAPARIN SODIUM 40 MG/0.4ML IJ SOSY: 40.0000 mg | PREFILLED_SYRINGE | INTRAMUSCULAR | 0 refills | Status: DC

## 2020-11-28 MED ORDER — OXYCODONE HCL 5 MG PO TABS: 5.0000 mg | ORAL_TABLET | ORAL | 0 refills | Status: DC | PRN

## 2020-11-28 NOTE — Progress Notes (Signed)
  Subjective:  Patient seen at 0730 hours.  POD #8 s/p left TKA.   Patient reports left knee pain as mild to moderate and Pain is improving.  Making progress with PT.      Objective:   VITALS:   Vitals:   11/27/20 1623 11/27/20 2207 11/28/20 0609 11/28/20 0843  BP: (!) 146/78 124/63 (!) 166/84 (!) 144/73  Pulse: 94 78 82 76  Resp: 16 18 18 17   Temp: 98.4 F (36.9 C) 98.2 F (36.8 C) 98.5 F (36.9 C)   TempSrc: Oral Oral Oral   SpO2: 99% 96% 98% 96%  Weight:      Height:        PHYSICAL EXAM: Left lower extremity Neurovascular intact Sensation intact distally Intact pulses distally Dorsiflexion/Plantar flexion intact Incision: no drainage No cellulitis present Compartment soft  LABS  Results for orders placed or performed during the hospital encounter of 11/20/20 (from the past 24 hour(s))  Glucose, capillary     Status: None   Collection Time: 11/27/20 12:14 PM  Result Value Ref Range   Glucose-Capillary 78 70 - 99 mg/dL  Glucose, capillary     Status: Abnormal   Collection Time: 11/27/20  6:22 PM  Result Value Ref Range   Glucose-Capillary 111 (H) 70 - 99 mg/dL  Glucose, capillary     Status: Abnormal   Collection Time: 11/27/20  9:09 PM  Result Value Ref Range   Glucose-Capillary 126 (H) 70 - 99 mg/dL  Glucose, capillary     Status: Abnormal   Collection Time: 11/27/20 10:11 PM  Result Value Ref Range   Glucose-Capillary 145 (H) 70 - 99 mg/dL   Comment 1 Notify RN    Comment 2 Document in Chart   Glucose, capillary     Status: Abnormal   Collection Time: 11/28/20  8:25 AM  Result Value Ref Range   Glucose-Capillary 116 (H) 70 - 99 mg/dL    No results found.  Assessment/Plan: 8 Days Post-Op   Active Problems:   S/P TKR (total knee replacement) using cement, left  Continue PT.  Will wean off Tramadol today and discontinue this scheduled. Appears we are still awaiting SNF approval which she will require based on PT and OT recommendations , as well as  social situation. PT/OT recommendations appreciated.  We will continue to check in daily until authorization. I have placed paper scripts for pain and lovenox in her chart to be given at discharge.    11/30/20 ,PA-C 11/28/2020, 10:30 AM

## 2020-11-28 NOTE — Progress Notes (Signed)
Physical Therapy Treatment Patient Details Name: Kristi Mccoy MRN: [id number] DOB: 11-25-1965 Today's Date: 11/28/2020   History of Present Illness Kristi Mccoy is a 55yoF who comes to [facility] on 11/20/20 for elective Left TKA.    PT Comments    Pt alert, reported 4/10 pain in LLE, premedicated prior to session. The patient was able to perform supine to sit with minA to assist with LLE due to pain. Several exercises performe to maximize  L knee flexion. Sit <> stand from bed with supervision, and was able to ambulate 1 lap with RW and CGA due to education gait. With cues for heel strike and decreased UE support on walker, the patient progressed to step through gait pattern without buckling. Returned to room with all needs in reach. The patient would benefit from further skilled PT intervention to continue to progress towards goals. Recommendation remains appropriate due to decreased caregiver support and change from PLOF.    Recommendations for follow up therapy are one component of a multi-disciplinary discharge planning process, led by the attending physician.  Recommendations may be updated based on patient status, additional functional criteria and insurance authorization.  Follow Up Recommendations  SNF     Equipment Recommendations  Rolling walker with 5" wheels    Recommendations for Other Services       Precautions / Restrictions Precautions Precautions: Fall;Knee Required Braces or Orthoses: Knee Immobilizer - Left Knee Immobilizer - Left: Other (comment) (when in bed) Restrictions Weight Bearing Restrictions: Yes LLE Weight Bearing: Weight bearing as tolerated     Mobility  Bed Mobility Overal bed mobility: Needs Assistance Bed Mobility: Sit to Supine     Supine to sit: Min assist     General bed mobility comments: minA for LLE assist due to pain    Transfers Overall transfer level: Needs assistance Equipment used: Rolling walker (2 wheeled) Transfers: Sit to/from  Stand Sit to Stand: Supervision            Ambulation/Gait Ambulation/Gait assistance: Min guard Gait Distance (Feet): 160 Feet Assistive device: Rolling walker (2 wheeled)   Gait velocity: decreased   General Gait Details: cued for heel strike, decreased UE weight bearing. progressed to step through gait pattern   Stairs             Wheelchair Mobility    Modified Rankin (Stroke Patients Only)       Balance Overall balance assessment: Needs assistance Sitting-balance support: Feet supported Sitting balance-Leahy Scale: Good Sitting balance - Comments: Steady static sitting, reaching within BOS. No LOB appreciated with weight shift during LBD task.   Standing balance support: Bilateral upper extremity supported Standing balance-Leahy Scale: Good Standing balance comment: Less usage of UE on RW during ambulation. No loss of balance noted                            Cognition Arousal/Alertness: Awake/alert Behavior During Therapy: WFL for tasks assessed/performed Overall Cognitive Status: Within Functional Limits for tasks assessed                                 General Comments: Pain limited however, able to complete treatment      Exercises Total Joint Exercises Heel Slides: Seated;Supine;AAROM;15 reps    General Comments        Pertinent Vitals/Pain Pain Assessment: 0-10 Pain Score: 4  Pain Location: Operative knee with mobility  Pain Descriptors / Indicators: Guarding;Grimacing;Sore;Throbbing;Tightness Pain Intervention(s): Limited activity within patient's tolerance;Monitored during session;Premedicated before session;Repositioned;Ice applied    Home Living                      Prior Function            PT Goals (current goals can now be found in the care plan section) Progress towards PT goals: Progressing toward goals    Frequency    BID      PT Plan Current plan remains appropriate     Co-evaluation              AM-PAC PT "6 Clicks" Mobility   Outcome Measure  Help needed turning from your back to your side while in a flat bed without using bedrails?: None Help needed moving from lying on your back to sitting on the side of a flat bed without using bedrails?: None Help needed moving to and from a bed to a chair (including a wheelchair)?: A Little Help needed standing up from a chair using your arms (e.g., wheelchair or bedside chair)?: A Little Help needed to walk in hospital room?: A Little Help needed climbing 3-5 steps with a railing? : A Little 6 Click Score: 20    End of Session Equipment Utilized During Treatment: Gait belt Activity Tolerance: Patient tolerated treatment well Patient left: with call [place]an/phone within reach;in chair;with chair alarm set Nurse Communication: Mobility status PT Visit Diagnosis: Difficulty in walking, not elsewhere classified (R26.2);Muscle weakness (generalized) (M62.81);Dizziness and giddiness (R42);Other symptoms and signs involving the nervous system (R29.898)     Time: 3154-0086 PT Time Calculation (min) (ACUTE ONLY): 27 min  Charges:  $Gait Training: 8-22 mins $Therapeutic Exercise: 8-22 mins                     Olga Coaster PT, DPT 10:18 AM,11/28/20

## 2020-11-28 NOTE — TOC Progression Note (Signed)
Transition of Care [facility]) - Progression Note    Patient Details  Name: Kristi Mccoy MRN: [id number] Date of Birth: 14-Sep-1965  Transition of [facility]) CM/SW Contact  Barrie Dunker, RN Phone Number: 11/28/2020, 9:02 AM  Clinical Narrative:   Reached out to Rosey Bath at [employer] in [place] and inquired on the status, She had to resubmit the clinical for authorization. She will keep checking on status         Expected Discharge Plan and Services                                                 Social Determinants of Health (SDOH) Interventions    Readmission Risk Interventions No flowsheet data found.

## 2020-11-28 NOTE — Progress Notes (Signed)
Physical Therapy Treatment Patient Details Name: Kristi Mccoy MRN: [id number] DOB: 04/01/1965 Today's Date: 11/28/2020   History of Present Illness Kristi Mccoy is a 55yoF who comes to [facility] on 11/20/20 for elective Left TKA.    PT Comments    Pt alert in bed, states pain to LLE 6/10 with movement, RN provided pain meds prior to session. Pt demonstrated excellent step-through gait pattern w/ heel strike this session, MIN-G, RW. SUPV for transfers, RW, no reminders for correct hand placement from lowered surface. Pt requires MIN-A for sit > supine transfers for LE management. Reviewed proper technique for LE support independently. Skilled PT intervention is indicated to address deficits in function, mobility, and to return to PLOF as able.  Discharge recommendations remain SNF to return to PLOF in order to maximize functional mobility necessary.  Recommendations for follow up therapy are one component of a multi-disciplinary discharge planning process, led by the attending physician.  Recommendations may be updated based on patient status, additional functional criteria and insurance authorization.  Follow Up Recommendations  SNF     Equipment Recommendations  Rolling walker with 5" wheels    Recommendations for Other Services       Precautions / Restrictions Precautions Precautions: Fall;Knee Required Braces or Orthoses: Knee Immobilizer - Left Knee Immobilizer - Left: Other (comment) (when in bed) Restrictions Weight Bearing Restrictions: Yes LLE Weight Bearing: Weight bearing as tolerated     Mobility  Bed Mobility Overal bed mobility: Needs Assistance Bed Mobility: Sit to Supine     Supine to sit: Supervision Sit to supine: Min assist   General bed mobility comments: minA for LLE assist    Transfers Overall transfer level: Needs assistance Equipment used: Rolling walker (2 wheeled) Transfers: Sit to/from Stand Sit to Stand: Supervision             Ambulation/Gait Ambulation/Gait assistance: Min guard Gait Distance (Feet): 180 Feet Assistive device: Rolling walker (2 wheeled) Gait Pattern/deviations: Step-through pattern Gait velocity: decreased   General Gait Details: cues for heel strike and increasing WB through LE vs LE   Stairs             Wheelchair Mobility    Modified Rankin (Stroke Patients Only)       Balance Overall balance assessment: Needs assistance Sitting-balance support: Feet supported Sitting balance-Leahy Scale: Good     Standing balance support: Bilateral upper extremity supported Standing balance-Leahy Scale: Good                              Cognition Arousal/Alertness: Awake/alert Behavior During Therapy: WFL for tasks assessed/performed Overall Cognitive Status: Within Functional Limits for tasks assessed                                        Exercises      General Comments        Pertinent Vitals/Pain Pain Assessment: 0-10 Pain Score: 6  Pain Location: Operative knee with mobility Pain Descriptors / Indicators: Guarding;Grimacing;Sore;Aching Pain Intervention(s): Limited activity within patient's tolerance;Monitored during session;Premedicated before session;Repositioned;Ice applied    Home Living                      Prior Function            PT Goals (current goals can now be found in the care  plan section) Progress towards PT goals: Progressing toward goals    Frequency    BID      PT Plan Current plan remains appropriate    Co-evaluation              AM-PAC PT "6 Clicks" Mobility   Outcome Measure  Help needed turning from your back to your side while in a flat bed without using bedrails?: None Help needed moving from lying on your back to sitting on the side of a flat bed without using bedrails?: None Help needed moving to and from a bed to a chair (including a wheelchair)?: A Little Help needed  standing up from a chair using your arms (e.g., wheelchair or bedside chair)?: A Little Help needed to walk in hospital room?: A Little Help needed climbing 3-5 steps with a railing? : A Little 6 Click Score: 20    End of Session Equipment Utilized During Treatment: Gait belt   Patient left: with call [place]/phone within reach;in bed;with bed alarm set;with SCD's reapplied Nurse Communication: Mobility status PT Visit Diagnosis: Difficulty in walking, not elsewhere classified (R26.2);Muscle weakness (generalized) (M62.81);Dizziness and giddiness (R42);Other symptoms and signs involving the nervous system (R29.898)     Time: 4097-3532 PT Time Calculation (min) (ACUTE ONLY): 38 min  Charges:                        [employer], SPT

## 2020-11-29 LAB — RESP PANEL BY RT-PCR (FLU A&B, COVID) ARPGX2
Influenza A by PCR: NEGATIVE
Influenza B by PCR: NEGATIVE
SARS Coronavirus 2 by RT PCR: POSITIVE — AB

## 2020-11-29 LAB — GLUCOSE, CAPILLARY
Glucose-Capillary: 113 mg/dL — ABNORMAL HIGH (ref 70–99)
Glucose-Capillary: 108 mg/dL — ABNORMAL HIGH (ref 70–99)
Glucose-Capillary: 139 mg/dL — ABNORMAL HIGH (ref 70–99)
Glucose-Capillary: 138 mg/dL — ABNORMAL HIGH (ref 70–99)

## 2020-11-29 MED ORDER — SENNOSIDES-DOCUSATE SODIUM 8.6-50 MG PO TABS: 1.0000 | ORAL_TABLET | Freq: Every evening | ORAL | 0 refills | Status: AC | PRN

## 2020-11-29 NOTE — Progress Notes (Signed)
Physical Therapy Treatment Patient Details Name: Kristi Mccoy MRN: [id number] DOB: [birth year] Today's Date: 11/29/2020   History of Present Illness Kristi Mccoy is a 55yoF who comes to [facility] on 11/20/20 for elective Left TKA.    PT Comments    Pt alert in bed, agreeable to therapy. Pt ambulated around nurses station w/ SUPV, RW, no cues necessary for decreasing WB through BU this session and improvement in heel strike. Continued to work on seated knee flexion with gravity and AAROM assist. Current measurements 0-69 degrees. Skilled PT intervention is indicated to address deficits in function, mobility, and to return to PLOF as able.  Discharge recommendations remain SNF.   Recommendations for follow up therapy are one component of a multi-disciplinary discharge planning process, led by the attending physician.  Recommendations may be updated based on patient status, additional functional criteria and insurance authorization.  Follow Up Recommendations  SNF     Equipment Recommendations  Rolling walker with 5" wheels    Recommendations for Other Services       Precautions / Restrictions Precautions Precautions: Fall;Knee Required Braces or Orthoses: Knee Immobilizer - Left Knee Immobilizer - Left: Other (comment) (in bed) Restrictions Weight Bearing Restrictions: Yes LLE Weight Bearing: Weight bearing as tolerated     Mobility  Bed Mobility Overal bed mobility: Needs Assistance Bed Mobility: Supine to Sit     Supine to sit: Supervision;HOB elevated Sit to supine: Min guard   General bed mobility comments: Cues for sit > supine transfer for BLE    Transfers Overall transfer level: Needs assistance Equipment used: Rolling walker (2 wheeled) Transfers: Sit to/from Stand Sit to Stand: Supervision            Ambulation/Gait Ambulation/Gait assistance: Supervision Gait Distance (Feet): 180 Feet Assistive device: Rolling walker (2 wheeled) Gait Pattern/deviations:  Step-through pattern Gait velocity: decreased   General Gait Details: cues for heel strike and increasing WB through LE vs UE   Stairs             Wheelchair Mobility    Modified Rankin (Stroke Patients Only)       Balance Overall balance assessment: Needs assistance Sitting-balance support: Feet supported Sitting balance-Leahy Scale: Good Sitting balance - Comments: Pt is able to reach outside BOS without LOB   Standing balance support: Bilateral upper extremity supported;During functional activity Standing balance-Leahy Scale: Good Standing balance comment: Able to maintain static balance without UE support, requies BUE support for dynamic balance                            Cognition Arousal/Alertness: Awake/alert Behavior During Therapy: WFL for tasks assessed/performed Overall Cognitive Status: Within Functional Limits for tasks assessed                                        Exercises Total Joint Exercises Knee Flexion: AROM;15 reps;Seated;AAROM Goniometric ROM: 0-69 Other Exercises Other Exercises: Pt education on HEP and knee flexion exercises to improve AROM    General Comments        Pertinent Vitals/Pain Pain Assessment: 0-10 Pain Score: 6  Faces Pain Scale: Hurts even more Pain Location: Operative knee with mobility Pain Descriptors / Indicators: Aching;Tightness;Sore Pain Intervention(s): Limited activity within patient's tolerance;Monitored during session;Premedicated before session;Repositioned;Ice applied    Home Living  Prior Function            PT Goals (current goals can now be found in the care plan section) Progress towards PT goals: Progressing toward goals    Frequency    BID      PT Plan Current plan remains appropriate    Co-evaluation              AM-PAC PT "6 Clicks" Mobility   Outcome Measure  Help needed turning from your back to your side while  in a flat bed without using bedrails?: None Help needed moving from lying on your back to sitting on the side of a flat bed without using bedrails?: None Help needed moving to and from a bed to a chair (including a wheelchair)?: A Little Help needed standing up from a chair using your arms (e.g., wheelchair or bedside chair)?: None Help needed to walk in hospital room?: A Little Help needed climbing 3-5 steps with a railing? : A Little 6 Click Score: 21    End of Session Equipment Utilized During Treatment: Gait belt Activity Tolerance: Patient tolerated treatment well Patient left: with call Jurgensen/phone within reach;with SCD's reapplied;in chair;with chair alarm set   PT Visit Diagnosis: Difficulty in walking, not elsewhere classified (R26.2);Muscle weakness (generalized) (M62.81);Dizziness and giddiness (R42);Other symptoms and signs involving the nervous system (R29.898)     Time: 4401-0272 PT Time Calculation (min) (ACUTE ONLY): 44 min  Charges:  $Gait Training: 8-22 mins $Therapeutic Exercise: 8-22 mins                     [employer], SPT

## 2020-11-29 NOTE — Discharge Instructions (Signed)
The patient may continue to bear weight on the LEFT lower extremity with use of a walker. The patient should continue to use TED stockings until follow-up. Patient should remove the TED stockings at night for sleep. The patient needs to continue to elevate the LEFT lower extremity whenever possible. The knee immobilizer should be used at night. The patient may remove the knee immobilizer to perform exercises or sit in a chair during the day.  Patient should not place a pillow under their knee. The Polar Care may be used by the patient for comfort.  The dressing should remain on until follow up in the office.   The patient must cover the LEFT knee dressing/incision during showers with a plastic bag or Saran wrap.  The patient will use Lovenox injections daily a day for blood clot prevention until discharge from Skilled nursing facility and continue to work on knee range of motion exercises at home as instructed by physical therapy until follow-up in the office.

## 2020-11-29 NOTE — Progress Notes (Signed)
Occupational Therapy Treatment Patient Details Name: Kristi Mccoy MRN: [id number] DOB: [birth year] Today's Date: 11/29/2020   History of present illness Kristi Mccoy is a 28yoF who comes to [facility] on 11/20/20 for elective Left TKA.   OT comments  Pt seen for OT tx this date to f/u re: safety with ADLs/ADL mobility. OT engages pt in LB ADLs with MOD A, pt could benefit from AE use. Pt requires SUPV to come to stand x2 trials. Pt back to bed with CGA d/t pain and OT engages pt in education re: knee positioning. Pt left with all needs met and in reach. Will continue to follow.   Recommendations for follow up therapy are one component of a multi-disciplinary discharge planning process, led by the attending physician.  Recommendations may be updated based on patient status, additional functional criteria and insurance authorization.    Follow Up Recommendations  SNF    Equipment Recommendations  3 in 1 bedside commode    Recommendations for Other Services      Precautions / Restrictions Precautions Precautions: Fall;Knee Required Braces or Orthoses: Knee Immobilizer - Left Knee Immobilizer - Left: Other (comment) (in bed) Restrictions Weight Bearing Restrictions: Yes LLE Weight Bearing: Weight bearing as tolerated       Mobility Bed Mobility Overal bed mobility: Needs Assistance Bed Mobility: Sit to Supine     Supine to sit: Supervision;HOB elevated Sit to supine: Min assist   General bed mobility comments: assist for using R LE to support post-op L LE for back to bed, increased assist over PT session earlier d/t increased pain/fatigue at time of OT tx    Transfers Overall transfer level: Needs assistance Equipment used: Rolling walker (2 wheeled) Transfers: Sit to/from Stand Sit to Stand: Supervision         General transfer comment: increased time, some slight steadying assist, but no physical assist to actually come to standing. C/o pain with WB    Balance Overall balance  assessment: Needs assistance Sitting-balance support: Feet supported Sitting balance-Leahy Scale: Good     Standing balance support: Bilateral upper extremity supported;During functional activity Standing balance-Leahy Scale: Good                             ADL either performed or assessed with clinical judgement   ADL Overall ADL's : Needs assistance/impaired                     Lower Body Dressing: Moderate assistance;Sit to/from stand Lower Body Dressing Details (indicate cue type and reason): MOD A for threading, will benefit from AE use                     Vision Patient Visual Report: No change from baseline     Perception     Praxis      Cognition Arousal/Alertness: Awake/alert Behavior During Therapy: WFL for tasks assessed/performed Overall Cognitive Status: Within Functional Limits for tasks assessed                                          Exercises Total Joint Exercises Knee Flexion: AROM;15 reps;Seated;AAROM Goniometric ROM: 0-69 Other Exercises Other Exercises: OT engaes pt in ed re: knee positioning in bed as well as seated LB ADLs and STS with RW.   Shoulder Instructions  General Comments      Pertinent Vitals/ Pain       Pain Assessment: Faces Pain Score: 6  Faces Pain Scale: Hurts even more Pain Location: Operative knee with mobility Pain Descriptors / Indicators: Aching;Tightness;Sore Pain Intervention(s): Limited activity within patient's tolerance;Monitored during session;Repositioned;Patient requesting pain meds-RN notified  Home Living                                          Prior Functioning/Environment              Frequency  Min 2X/week        Progress Toward Goals  OT Goals(current goals can now be found in the care plan section)  Progress towards OT goals: Progressing toward goals  Acute Rehab OT Goals Patient Stated Goal: rehab then home OT  Goal Formulation: With patient Time For Goal Achievement: 12/05/20 Potential to Achieve Goals: Good  Plan Discharge plan remains appropriate;Frequency remains appropriate    Co-evaluation                 AM-PAC OT "6 Clicks" Daily Activity     Outcome Measure   Help from another person eating meals?: A Little   Help from another person toileting, which includes using toliet, bedpan, or urinal?: A Little   Help from another person to put on and taking off regular upper body clothing?: A Little Help from another person to put on and taking off regular lower body clothing?: A Lot 6 Click Score: 11    End of Session Equipment Utilized During Treatment: Rolling walker;Gait belt  OT Visit Diagnosis: Other abnormalities of gait and mobility (R26.89);Pain Pain - Right/Left: Left Pain - part of body: Knee;Leg   Activity Tolerance Patient tolerated treatment well   Patient Left in bed;with call Garis/phone within reach   Nurse Communication Patient requests pain meds;Mobility status        Time: 1700-1727 OT Time Calculation (min): 27 min  Charges: OT General Charges $OT Visit: 1 Visit OT Treatments $Self Care/Home Management : 8-22 mins $Therapeutic Activity: 8-22 mins  Alison Sell, MS, OTR/L ascom [phone] 11/29/20, 6:38 PM  

## 2020-11-29 NOTE — TOC Progression Note (Signed)
Transition of Care [facility]) - Progression Note    Patient Details  Name: Kristi Mccoy MRN: [id number] Date of Birth: 12-Mar-1965  Transition of Care [facility]) CM/SW [place], RN Phone Number: 11/29/2020, 10:41 AM  Clinical Narrative:    Met with the patient, I explained we are hoping to get auth approval to go to [place] today, Will keep her updated about the auth status        Expected Discharge Plan and Services                                                 Social Determinants of Health (SDOH) Interventions    Readmission Risk Interventions No flowsheet data found.

## 2020-11-29 NOTE — Progress Notes (Signed)
  Subjective:  Patient seen at 0730 hours.  POD #9 s/p left TKA.   Patient reports left knee pain as mild to moderate and Pain is improving.  Making progress with PT.      Objective:   VITALS:   Vitals:   11/28/20 1212 11/28/20 1647 11/29/20 0004 11/29/20 0518  BP: (!) 160/76 (!) 142/75 130/72 128/76  Pulse: 87 92 77 72  Resp: 16 17 17 16   Temp: 98.3 F (36.8 C) 98.1 F (36.7 C) 98.1 F (36.7 C) 97.8 F (36.6 C)  TempSrc:      SpO2: 100% 95% 95%   Weight:      Height:        PHYSICAL EXAM: Left lower extremity Neurovascular intact Sensation intact distally Intact pulses distally Dorsiflexion/Plantar flexion intact Incision: no drainage No cellulitis present Compartment soft  LABS  Results for orders placed or performed during the hospital encounter of 11/20/20 (from the past 24 hour(s))  Glucose, capillary     Status: Abnormal   Collection Time: 11/28/20  8:25 AM  Result Value Ref Range   Glucose-Capillary 116 (H) 70 - 99 mg/dL  Glucose, capillary     Status: None   Collection Time: 11/28/20 12:59 PM  Result Value Ref Range   Glucose-Capillary 77 70 - 99 mg/dL  Glucose, capillary     Status: Abnormal   Collection Time: 11/28/20  4:44 PM  Result Value Ref Range   Glucose-Capillary 113 (H) 70 - 99 mg/dL  Glucose, capillary     Status: Abnormal   Collection Time: 11/28/20  8:55 PM  Result Value Ref Range   Glucose-Capillary 130 (H) 70 - 99 mg/dL    No results found.  Assessment/Plan: 9 Days Post-Op   Active Problems:   S/P TKR (total knee replacement) using cement, left  Continue PT.  PT continues to Recommended SNF, SNF approval still pending per case management. Did well with holding off tramadol. Discussed need to use Knee immobilizer at rest.   11/30/20 ,PA-C 11/29/2020, 8:04 AM

## 2020-11-29 NOTE — Progress Notes (Signed)
Physical Therapy Treatment Patient Details Name: Kristi Mccoy MRN: [id number] DOB: Oct 08, 1965 Today's Date: 11/29/2020   History of Present Illness Kristi Mccoy is a 55yoF who comes to [facility] on 11/20/20 for elective Left TKA.    PT Comments    Pt alert, cooperative throughout therapy session. Performed seated knee flexion with AROM & AAROM due to limitations in ROM. Pt progressed in minimizing bed mobility assist sit > supine requiring MIN G for technique cues. Pt ambulated around nurses station w/ RW, MIN G for safety with less WB through UE compared to previous sessions. Skilled PT intervention is indicated to address deficits in function, mobility, and to return to PLOF as able.    Recommendations for follow up therapy are one component of a multi-disciplinary discharge planning process, led by the attending physician.  Recommendations may be updated based on patient status, additional functional criteria and insurance authorization.  Follow Up Recommendations  SNF     Equipment Recommendations  Rolling walker with 5" wheels    Recommendations for Other Services       Precautions / Restrictions Precautions Precautions: Fall;Knee Required Braces or Orthoses: Knee Immobilizer - Left Knee Immobilizer - Left: Other (comment) (when in bed) Restrictions Weight Bearing Restrictions: Yes LLE Weight Bearing: Weight bearing as tolerated     Mobility  Bed Mobility Overal bed mobility: Needs Assistance Bed Mobility: Sit to Supine;Supine to Sit     Supine to sit: Supervision;HOB elevated Sit to supine: Min guard   General bed mobility comments: Cues for sit > supine transfer for BLE    Transfers Overall transfer level: Needs assistance Equipment used: Rolling walker (2 wheeled) Transfers: Sit to/from Stand Sit to Stand: Supervision            Ambulation/Gait Ambulation/Gait assistance: Min guard Gait Distance (Feet): 180 Feet Assistive device: Rolling walker (2  wheeled) Gait Pattern/deviations: Step-through pattern Gait velocity: decreased   General Gait Details: cues for heel strike and increasing WB through LE vs UE   Stairs             Wheelchair Mobility    Modified Rankin (Stroke Patients Only)       Balance Overall balance assessment: Needs assistance Sitting-balance support: Feet supported Sitting balance-Leahy Scale: Good Sitting balance - Comments: Pt is able to reach outside BOS without LOB   Standing balance support: Bilateral upper extremity supported;During functional activity Standing balance-Leahy Scale: Good Standing balance comment: Able to maintain static balance without UE support, requies BUE support for dynamic balance                            Cognition Arousal/Alertness: Awake/alert Behavior During Therapy: WFL for tasks assessed/performed Overall Cognitive Status: Within Functional Limits for tasks assessed                                        Exercises Total Joint Exercises Knee Flexion: AROM;15 reps;Seated;AAROM Goniometric ROM: 0 - 65    General Comments        Pertinent Vitals/Pain Pain Assessment: Faces Faces Pain Scale: Hurts even more Pain Location: Operative knee with mobility Pain Descriptors / Indicators: Aching;Tightness;Sore Pain Intervention(s): Limited activity within patient's tolerance;Monitored during session;Repositioned    Home Living                      Prior  Function            PT Goals (current goals can now be found in the care plan section)      Frequency    BID      PT Plan Current plan remains appropriate    Co-evaluation              AM-PAC PT "6 Clicks" Mobility   Outcome Measure  Help needed turning from your back to your side while in a flat bed without using bedrails?: None Help needed moving from lying on your back to sitting on the side of a flat bed without using bedrails?: None Help needed  moving to and from a bed to a chair (including a wheelchair)?: A Little Help needed standing up from a chair using your arms (e.g., wheelchair or bedside chair)?: None Help needed to walk in hospital room?: A Little Help needed climbing 3-5 steps with a railing? : A Little 6 Click Score: 21    End of Session Equipment Utilized During Treatment: Gait belt Activity Tolerance: Patient tolerated treatment well Patient left: with call Mikus/phone within reach;with bed alarm set;with SCD's reapplied;in bed   PT Visit Diagnosis: Difficulty in walking, not elsewhere classified (R26.2);Muscle weakness (generalized) (M62.81);Dizziness and giddiness (R42);Other symptoms and signs involving the nervous system (R29.898)     Time: 8119-1478 PT Time Calculation (min) (ACUTE ONLY): 31 min  Charges:                       [employer], SPT

## 2020-11-29 NOTE — Plan of Care (Signed)

## 2020-11-29 NOTE — Discharge Summary (Signed)
Physician Discharge Summary  Patient ID: Kristi Mccoy MRN: [id number] DOB/AGE: 1965/06/19 55 y.o.  Admit date: 11/20/2020 Discharge date: 11/30/2020  Admission Diagnoses:  Left Knee Osteoarthritis <principal problem not specified>  Discharge Diagnoses:  Left Knee Osteoarthritis Active Problems:   S/P TKR (total knee replacement) using cement, left   Past Medical History:  Diagnosis Date   Anxiety    Arthritis    Cough    Depression    Diabetes mellitus without complication (HCC)    Dyspnea    GERD (gastroesophageal reflux disease)    History of hiatal hernia    History of kidney stones     Surgeries: Procedure(s): LEFT TOTAL KNEE ARTHROPLASTY on 11/20/2020   Consultants (if any):   Discharged Condition: Improved  Hospital Course: Kristi Mccoy is an 55 y.o. female who was admitted 11/20/2020 with a diagnosis of  Left Knee Osteoarthritis <principal problem not specified> and went to the operating room on 11/20/2020 and underwent the above named procedures.    She was given perioperative antibiotics:  Anti-infectives (From admission, onward)    Start     Dose/Rate Route Frequency Ordered Stop   11/20/20 1400  ceFAZolin (ANCEF) IVPB 2g/100 mL premix        2 g 200 mL/hr over 30 Minutes Intravenous Every 6 hours 11/20/20 1228 11/21/20 2036   11/20/20 0633  ceFAZolin (ANCEF) 2-4 GM/100ML-% IVPB       Note to Pharmacy: Mikey Bussing   : cabinet override      11/20/20 0633 11/21/20 2036   11/20/20 0632  clindamycin (CLEOCIN) 600 MG/50ML IVPB       Note to Pharmacy: Mikey Bussing   : cabinet override      11/20/20 0632 11/20/20 0806   11/20/20 0630  clindamycin (CLEOCIN) IVPB 600 mg        600 mg 100 mL/hr over 30 Minutes Intravenous  Once 11/20/20 0629 11/20/20 0806   11/20/20 0630  ceFAZolin (ANCEF) IVPB 2g/100 mL premix        2 g 200 mL/hr over 30 Minutes Intravenous On call to O.R. 11/20/20 1093 11/20/20 0806     .  She was given sequential compression devices, early  ambulation, and Lovenox for DVT prophylaxis.  She benefited maximally from the hospital stay and there were no complications.    Was found to be COVID positive on Screening at DC , however recovered from Recent COVID infection 8/31. Per Case Management , expected positive and patient has been asymptomatic from a COVID standpoint throughout hospital course. Per Case Management, clear to Discharge to SNF  Recent vital signs:  Vitals:   11/29/20 2345 11/30/20 0512  BP: 128/73 (!) 144/76  Pulse: 76 75  Resp: 16 16  Temp: 98.2 F (36.8 C) 98.2 F (36.8 C)  SpO2: 98% 100%    Recent laboratory studies:  Lab Results  Component Value Date   HGB 11.3 (L) 11/22/2020   HGB 11.8 (L) 11/21/2020   HGB 14.8 11/19/2020   Lab Results  Component Value Date   WBC 13.3 (H) 11/22/2020   PLT 248 11/22/2020   Lab Results  Component Value Date   INR 1.0 11/19/2020   Lab Results  Component Value Date   NA 137 11/21/2020   K 4.0 11/21/2020   CL 106 11/21/2020   CO2 26 11/21/2020   BUN 12 11/21/2020   CREATININE 0.54 11/27/2020   GLUCOSE 160 (H) 11/21/2020    Discharge Medications:   Allergies as of 11/30/2020  Reactions   Azithromycin Swelling   Painful, swollen joints        Medication List     STOP taking these medications    ibuprofen 200 MG tablet Commonly known as: ADVIL       TAKE these medications    clonazePAM 1 MG tablet Commonly known as: KLONOPIN Take 1-2 mg by mouth 3 (three) times daily as needed for anxiety.   enoxaparin 40 MG/0.4ML injection Commonly known as: LOVENOX Inject 0.4 mLs (40 mg total) into the skin daily for 14 days.   omeprazole 20 MG capsule Commonly known as: PRILOSEC Take 20 mg by mouth daily with supper.   ondansetron 4 MG disintegrating tablet Commonly known as: Zofran ODT Take 1 tablet (4 mg total) by mouth every 8 (eight) hours as needed for nausea or vomiting.   oxyCODONE 5 MG immediate release tablet Commonly known as:  Roxicodone Take 1 tablet (5 mg total) by mouth every 4 (four) hours as needed for severe pain.   senna-docusate 8.6-50 MG tablet Commonly known as: Senokot-S Take 1 tablet by mouth at bedtime as needed for mild constipation.   sertraline 100 MG tablet Commonly known as: ZOLOFT Take 100 mg by mouth daily.   Trijardy XR 5-2.07-998 MG Tb24 Generic drug: Empagliflozin-Linaglip-Metform Take 1 tablet by mouth daily with breakfast.   zolpidem 10 MG tablet Commonly known as: AMBIEN Take 5-10 mg by mouth at bedtime.               Durable Medical Equipment  (From admission, onward)           Start     Ordered   11/30/20 1640  For home use only DME 4 wheeled rolling walker with seat  Once       Question:  Patient needs a walker to treat with the following condition  Answer:  History of knee replacement, total, left   11/29/20 1641            Diagnostic Studies: Chest 2 View  Result Date: 11/19/2020 CLINICAL DATA:  55 year old female with a history of preoperative chest x-ray EXAM: CHEST - 2 VIEW COMPARISON:  03/09/2018 FINDINGS: Cardiomediastinal silhouette unchanged in size and contour. No evidence of central vascular congestion. No interlobular septal thickening. No pneumothorax or pleural effusion. Coarsened interstitial markings, with no confluent airspace disease. No acute displaced fracture. Degenerative changes of the spine. IMPRESSION: No active cardiopulmonary disease. Electronically Signed   By: Gilmer Mor D.O.   On: 11/19/2020 14:38   DG Knee Left Port  Result Date: 11/20/2020 CLINICAL DATA:  Postop knee replacement EXAM: PORTABLE LEFT KNEE - 1-2 VIEW COMPARISON:  None. FINDINGS: Two views study shows tricompartmental knee replacement. Gas in the soft tissues is compatible with the surgery. No evidence for immediate hardware complication. IMPRESSION: Status post tricompartmental knee replacement. No evidence for immediate hardware complication. Electronically  Signed   By: Kennith Center M.D.   On: 11/20/2020 11:59    Disposition: Discharge disposition: 03-Skilled Nursing Facility     Discharge to SNF at recommendation of PT due to mobility issues postoperatively. Lovenox daily for DVT prophylaxis while in SNF. Paper script for Oxycodone and Lovenox to accompany patient . Will follow up in office as already scheduled on 12/07/20.    Contact information for after-discharge care     Destination     [facility] Preferred SNF .   Service: Skilled Paramedic information: [address] [place] [address] [phone]  Signed: Cam Hai ,PA-C 11/30/2020, 8:46 AM

## 2020-11-29 NOTE — Plan of Care (Signed)
  Problem: Education: Goal: Knowledge of the prescribed therapeutic regimen will improve Outcome: Progressing   Problem: Pain Management: Goal: Pain level will decrease with appropriate interventions Outcome: Progressing   Problem: Skin Integrity: Goal: Will show signs of wound healing Outcome: Progressing   Problem: Education: Goal: Knowledge of General Education information will improve Description: Including pain rating scale, medication(s)/side effects and non-pharmacologic comfort measures Outcome: Progressing   Problem: Health Behavior/Discharge Planning: Goal: Ability to manage health-related needs will improve Outcome: Progressing

## 2020-11-29 NOTE — Plan of Care (Signed)
  Problem: Education: Goal: Knowledge of the prescribed therapeutic regimen will improve 11/29/2020 1651 by Jean Rosenthal, RN Outcome: Progressing 11/29/2020 0759 by Jean Rosenthal, RN Outcome: Progressing Goal: Individualized Educational Video(s) 11/29/2020 1651 by Jean Rosenthal, RN Outcome: Progressing 11/29/2020 0759 by Jean Rosenthal, RN Outcome: Progressing   Problem: Activity: Goal: Ability to avoid complications of mobility impairment will improve 11/29/2020 1651 by Jean Rosenthal, RN Outcome: Progressing 11/29/2020 0759 by Jean Rosenthal, RN Outcome: Progressing Goal: Range of joint motion will improve 11/29/2020 1651 by Jean Rosenthal, RN Outcome: Progressing 11/29/2020 0759 by Jean Rosenthal, RN Outcome: Progressing   Problem: Clinical Measurements: Goal: Postoperative complications will be avoided or minimized 11/29/2020 1651 by Jean Rosenthal, RN Outcome: Progressing 11/29/2020 0759 by Jean Rosenthal, RN Outcome: Progressing

## 2020-11-30 LAB — GLUCOSE, CAPILLARY
Glucose-Capillary: 106 mg/dL — ABNORMAL HIGH (ref 70–99)
Glucose-Capillary: 120 mg/dL — ABNORMAL HIGH (ref 70–99)

## 2020-11-30 NOTE — TOC Progression Note (Signed)
Transition of Care [facility]) - Progression Note    Patient Details  Name: Kristi Mccoy MRN: [id number] Date of Birth: 1966/01/13  Transition of [facility]) CM/SW Contact  Barrie Dunker, RN Phone Number: 11/30/2020, 9:04 AM  Clinical Narrative:     The patient has a bed and insurance approval to go to [facility] in [place], [employer] to pick up at [employer], DC summary sent thru the [facility], The bedside Nurse to call report to [phone] going to room 107       Expected Discharge Plan and Services           Expected Discharge Date: 11/30/20                                     Social Determinants of Health (SDOH) Interventions    Readmission Risk Interventions No flowsheet data found.

## 2020-11-30 NOTE — Progress Notes (Signed)
Patient alert and oriented, but appear anxious with assess. She continues to complain of ongoing severe pain to left surgical knee. Due to discharge today to rehab facility, was noted Covid positive with testing yesterday. Vitals stable, no respiratory distress on room air. Left leg remains in immobilizer, dressing clean,dry and intact, polar care in place. Verbal text with Gerilyn Pilgrim, [place] and he inquired about patients status with transfer to the rehab facility, scheduled this morning at 9 am, due to Covid diagnosis. Message send to Deliah, Case manager to follow up with the matter. Will continue to monitor.

## 2020-12-31 ENCOUNTER — Other Ambulatory Visit: Payer: Self-pay

## 2020-12-31 ENCOUNTER — Emergency Department: Payer: BC Managed Care – PPO

## 2020-12-31 ENCOUNTER — Emergency Department
Admission: EM | Admit: 2020-12-31 | Discharge: 2020-12-31 | Disposition: A | Discharge status: Home / Self Care | Payer: BC Managed Care – PPO | Attending: Emergency Medicine | Admitting: Emergency Medicine

## 2020-12-31 DIAGNOSIS — Z7901 Long term (current) use of anticoagulants: Secondary | ICD-10-CM | POA: Insufficient documentation

## 2020-12-31 DIAGNOSIS — E119 Type 2 diabetes mellitus without complications: Secondary | ICD-10-CM | POA: Insufficient documentation

## 2020-12-31 DIAGNOSIS — Z96652 Presence of left artificial knee joint: Secondary | ICD-10-CM | POA: Insufficient documentation

## 2020-12-31 DIAGNOSIS — R079 Chest pain, unspecified: Secondary | ICD-10-CM

## 2020-12-31 DIAGNOSIS — Z7984 Long term (current) use of oral hypoglycemic drugs: Secondary | ICD-10-CM | POA: Insufficient documentation

## 2020-12-31 DIAGNOSIS — Z87891 Personal history of nicotine dependence: Secondary | ICD-10-CM | POA: Insufficient documentation

## 2020-12-31 DIAGNOSIS — I10 Essential (primary) hypertension: Secondary | ICD-10-CM | POA: Insufficient documentation

## 2020-12-31 LAB — COMPREHENSIVE METABOLIC PANEL
ALT: 13 U/L (ref 0–44)
AST: 17 U/L (ref 15–41)
Albumin: 4.5 g/dL (ref 3.5–5.0)
Alkaline Phosphatase: 92 U/L (ref 38–126)
Anion gap: 9 (ref 5–15)
BUN: 16 mg/dL (ref 6–20)
CO2: 25 mmol/L (ref 22–32)
Calcium: 9.5 mg/dL (ref 8.9–10.3)
Chloride: 103 mmol/L (ref 98–111)
Creatinine, Ser: 0.59 mg/dL (ref 0.44–1.00)
GFR, Estimated: >60 mL/min (ref >60)
Glucose, Bld: 96 mg/dL (ref 70–99)
Potassium: 3.7 mmol/L (ref 3.5–5.1)
Sodium: 137 mmol/L (ref 135–145)
Total Bilirubin: 0.7 mg/dL (ref 0.3–1.2)
Total Protein: 7.7 g/dL (ref 6.5–8.1)

## 2020-12-31 LAB — CBC WITH DIFFERENTIAL/PLATELET
Abs Immature Granulocytes: 0.02 10*3/uL (ref 0.00–0.07)
Basophils Absolute: 0 10*3/uL (ref 0.0–0.1)
Basophils Relative: 1 %
Eosinophils Absolute: 0 10*3/uL (ref 0.0–0.5)
Eosinophils Relative: 1 %
HCT: 40.2 % (ref 36.0–46.0)
Hemoglobin: 13.3 g/dL (ref 12.0–15.0)
Immature Granulocytes: 0 %
Lymphocytes Relative: 25 %
Lymphs Abs: 2 10*3/uL (ref 0.7–4.0)
MCH: 28 pg (ref 26.0–34.0)
MCHC: 33.1 g/dL (ref 30.0–36.0)
MCV: 84.6 fL (ref 80.0–100.0)
Monocytes Absolute: 0.3 10*3/uL (ref 0.1–1.0)
Monocytes Relative: 4 %
Neutro Abs: 5.5 10*3/uL (ref 1.7–7.7)
Neutrophils Relative %: 69 %
Platelets: 303 10*3/uL (ref 150–400)
RBC: 4.75 MIL/uL (ref 3.87–5.11)
RDW: 14.8 % (ref 11.5–15.5)
WBC: 7.9 10*3/uL (ref 4.0–10.5)
nRBC: 0 % (ref 0.0–0.2)

## 2020-12-31 LAB — URINALYSIS, ROUTINE W REFLEX MICROSCOPIC
Bilirubin Urine: NEGATIVE
Glucose, UA: >=500 mg/dL — AB
Hgb urine dipstick: NEGATIVE
Ketones, ur: NEGATIVE mg/dL
Nitrite: NEGATIVE
Protein, ur: NEGATIVE mg/dL
Specific Gravity, Urine: 1.024 (ref 1.005–1.030)
pH: 5 (ref 5.0–8.0)

## 2020-12-31 LAB — TROPONIN I (HIGH SENSITIVITY)
Troponin I (High Sensitivity): <2 ng/L (ref <18)
Troponin I (High Sensitivity): 2 ng/L (ref <18)

## 2020-12-31 LAB — D-DIMER, QUANTITATIVE: D-Dimer, Quant: 2.21 ug/mL-FEU — ABNORMAL HIGH (ref 0.00–0.50)

## 2020-12-31 MED ORDER — AMLODIPINE BESYLATE 5 MG PO TABS
5.0000 mg | ORAL_TABLET | Freq: Once | ORAL | Status: AC
  Administered 2020-12-31: 5 mg via ORAL
  Filled 2020-12-31: qty 1

## 2020-12-31 MED ORDER — AMLODIPINE BESYLATE 5 MG PO TABS: 5.0000 mg | ORAL_TABLET | Freq: Every day | ORAL | 1 refills | Status: AC

## 2020-12-31 MED ORDER — IOHEXOL 350 MG/ML SOLN
75.0000 mL | Freq: Once | INTRAVENOUS | Status: AC | PRN
  Administered 2020-12-31: 75 mL via INTRAVENOUS
  Filled 2020-12-31: qty 75

## 2020-12-31 MED ORDER — ACETAMINOPHEN 500 MG PO TABS
1000.0000 mg | ORAL_TABLET | Freq: Once | ORAL | Status: AC
  Administered 2020-12-31: 1000 mg via ORAL
  Filled 2020-12-31: qty 2

## 2020-12-31 NOTE — ED Provider Notes (Signed)
[facility] Emergency Department Provider Note   ____________________________________________   Event Date/Time   First MD Initiated Contact with Patient 12/31/20 1600     (approximate)  I have reviewed the triage vital signs and the nursing notes.   HISTORY  Chief Complaint Hypertension    HPI Kristi Mccoy is a 55 y.o. female with past medical history of diabetes and GERD who presents to the ED complaining of hypertension.  Patient reports that she recently underwent left total knee replacement, was discharged from rehab facility a couple of weeks ago.  Since then, they have been noting at her outpatient physical therapy appointments that her blood pressure has been running high.  She reports levels as high as 190/120, denies any history of hypertension and does not take any medicine for her blood pressure.  She states she has been dealing with intermittent headaches for about the past week, denies any associated numbness or weakness.  She has been taking Tylenol at home with relief of her headache.  She additionally reports intermittent pain in the right side of her chest for the past week.  She describes this as sharp, not exacerbated or alleviated by anything in particular.  She denies any fevers or cough.  She states her left knee surgical site has been healing well, swelling has been improving and she denies any significant redness or drainage.        Past Medical History:  Diagnosis Date   Anxiety    Arthritis    Cough    Depression    Diabetes mellitus without complication (HCC)    Dyspnea    GERD (gastroesophageal reflux disease)    History of hiatal hernia    History of kidney stones     Patient Active Problem List   Diagnosis Date Noted   S/P TKR (total knee replacement) using cement, left 11/20/2020    Past Surgical History:  Procedure Laterality Date   CESAREAN SECTION     x5 1994,1995,1996,1998,2002   LAPAROSCOPIC ASSISTED VAGINAL  HYSTERECTOMY  2004   left ovary removed   LITHOTRIPSY     multiple   TONSILLECTOMY     as a toddler   TOTAL KNEE ARTHROPLASTY Left 11/20/2020   Procedure: LEFT TOTAL KNEE ARTHROPLASTY;  Surgeon: Juanell Fairly, MD;  Location: [facility] ORS;  Service: Orthopedics;  Laterality: Left;    Prior to Admission medications   Medication Sig Start Date End Date Taking? Authorizing Provider  amLODipine (NORVASC) 5 MG tablet Take 1 tablet (5 mg total) by mouth daily. 12/31/20 12/31/21 Yes Chesley Noon, MD  clonazePAM (KLONOPIN) 1 MG tablet Take 1-2 mg by mouth 3 (three) times daily as needed for anxiety.  08/31/06   [provider]  Empagliflozin-Linaglip-Metform (TRIJARDY XR) 5-2.07-998 MG TB24 Take 1 tablet by mouth daily with breakfast.    [provider]  enoxaparin (LOVENOX) 40 MG/0.4ML injection Inject 0.4 mLs (40 mg total) into the skin daily for 14 days. 11/28/20 12/12/20  Darr, Gerilyn Pilgrim, PA-C  omeprazole (PRILOSEC) 20 MG capsule Take 20 mg by mouth daily with supper. 10/15/20   [provider]  ondansetron (ZOFRAN ODT) 4 MG disintegrating tablet Take 1 tablet (4 mg total) by mouth every 8 (eight) hours as needed for nausea or vomiting. 11/07/20   Viviano Simas, FNP  oxyCODONE (ROXICODONE) 5 MG immediate release tablet Take 1 tablet (5 mg total) by mouth every 4 (four) hours as needed for severe pain. 11/28/20   Darr, Gerilyn Pilgrim, PA-C  senna-docusate (  SENOKOT-S) 8.6-50 MG tablet Take 1 tablet by mouth at bedtime as needed for mild constipation. 11/29/20   Darr, Gerilyn Pilgrim, PA-C  sertraline (ZOLOFT) 100 MG tablet Take 100 mg by mouth daily. 01/21/07   [provider]  zolpidem (AMBIEN) 10 MG tablet Take 5-10 mg by mouth at bedtime. 05/01/11   [provider]    Allergies Azithromycin  Family History  Problem Relation Age of Onset   Diabetes Mother    Diabetes Father    Diabetes Sister     Social History Social History   Tobacco Use   Smoking status: Former     Types: Cigarettes   Smokeless tobacco: Never  Vaping Use   Vaping Use: Never used  Substance Use Topics   Alcohol use: No   Drug use: Not Currently    Comment: in her 20's    Review of Systems  Constitutional: No fever/chills Eyes: No visual changes. ENT: No sore throat. Cardiovascular: Positive for chest pain. Respiratory: Denies shortness of breath. Gastrointestinal: No abdominal pain.  No nausea, no vomiting.  No diarrhea.  No constipation. Genitourinary: Negative for dysuria. Musculoskeletal: Negative for back pain. Skin: Negative for rash. Neurological: Positive for headache, negative for focal weakness or numbness.  ____________________________________________   PHYSICAL EXAM:  VITAL SIGNS: ED Triage Vitals  Enc Vitals Group     BP 12/31/20 1255 (!) 190/106     Pulse Rate 12/31/20 1255 71     Resp 12/31/20 1255 18     Temp 12/31/20 1255 98.3 F (36.8 C)     Temp Source 12/31/20 1255 Oral     SpO2 12/31/20 1255 96 %     Weight 12/31/20 1301 199 lb (90.3 kg)     Height 12/31/20 1301 5\' 2"  (1.575 m)     Head Circumference --      Peak Flow --      Pain Score 12/31/20 1259 8     Pain Loc --      Pain Edu? --      Excl. in GC? --     Constitutional: Alert and oriented. Eyes: Conjunctivae are normal. Head: Atraumatic. Nose: No congestion/rhinnorhea. Mouth/Throat: Mucous membranes are moist. Neck: Normal ROM Cardiovascular: Normal rate, regular rhythm. Grossly normal heart sounds.  2+ radial pulses bilaterally. Respiratory: Normal respiratory effort.  No retractions. Lungs CTAB.  No chest wall tenderness to palpation. Gastrointestinal: Soft and nontender. No distention. Genitourinary: deferred Musculoskeletal: Left knee surgical site clean, dry, and intact with no erythema, warmth, or tenderness.  No lower extremity edema or tenderness to palpation. Neurologic:  Normal speech and language. No gross focal neurologic deficits are appreciated. Skin:  Skin is  warm, dry and intact. No rash noted. Psychiatric: Mood and affect are normal. Speech and behavior are normal.  ____________________________________________   LABS (all labs ordered are listed, but only abnormal results are displayed)  Labs Reviewed  URINALYSIS, ROUTINE W REFLEX MICROSCOPIC - Abnormal; Notable for the following components:      Result Value   Color, Urine YELLOW (*)    APPearance HAZY (*)    Glucose, UA >=500 (*)    Leukocytes,Ua LARGE (*)    Bacteria, UA RARE (*)    Non Squamous Epithelial PRESENT (*)    All other components within normal limits  D-DIMER, QUANTITATIVE - Abnormal; Notable for the following components:   D-Dimer, Quant 2.21 (*)    All other components within normal limits  CBC WITH DIFFERENTIAL/PLATELET  COMPREHENSIVE METABOLIC PANEL  TROPONIN  I (HIGH SENSITIVITY)  TROPONIN I (HIGH SENSITIVITY)   ____________________________________________  EKG  ED ECG REPORT I, Chesley Noon, the attending physician, personally viewed and interpreted this ECG.   Date: 12/31/2020  EKG Time: 13:06  Rate: 74  Rhythm: normal sinus rhythm  Axis: Normal  Intervals:none  ST&T Change: None   PROCEDURES  Procedure(s) performed (including Critical Care):  Procedures   ____________________________________________   INITIAL IMPRESSION / ASSESSMENT AND PLAN / ED COURSE      55 year old female with past medical history of diabetes and GERD who presents to the ED complaining of elevated blood pressures for the past couple of weeks when it has been checked at her physical therapist office.  She additionally has been dealing with intermittent headaches and right-sided chest pain for the past week.  Blood pressure again elevated here in the ED but there is no evidence of hypertensive emergency thus far.  She has no focal deficits on neurologic exam, EKG shows no evidence of arrhythmia or ischemia.  Labs are unremarkable, initial troponin is negative.  Given her  chest pain in the setting of recent orthopedic surgery, we will check D-dimer to rule out PE.  Chest x-ray reviewed by me and shows no infiltrate, edema, or effusion.  D-dimer is elevated, however CTA shows no evidence of PE or other acute process.  Repeat troponin within normal limits and I doubt ACS or dissection.  Patient given initial dose of amlodipine and blood pressure is gradually downtrending.  She is appropriate for discharge home with PCP follow-up, no evidence of hypertensive emergency at this time.  We will start her on amlodipine and she was counseled to return to the ED for new or worsening symptoms, patient agrees with plan.      ____________________________________________   FINAL CLINICAL IMPRESSION(S) / ED DIAGNOSES  Final diagnoses:  Nonspecific chest pain  Hypertension, unspecified type     ED Discharge Orders          Ordered    amLODipine (NORVASC) 5 MG tablet  Daily        12/31/20 2124             Note:  This document was prepared using Dragon voice recognition software and may include unintentional dictation errors.    Chesley Noon, MD 12/31/20 2126

## 2020-12-31 NOTE — ED Triage Notes (Signed)
Pt here with hypertension. Pt has left knee surgery on 11/20/20 and has had issues with her bp since. Pt states pressure was 180/120 before arriving to ED. Pt also states a HA and right side chest muscle pain. Pt in NAD in triage.

## 2020-12-31 NOTE — ED Provider Notes (Signed)
Emergency Medicine Provider Triage Evaluation Note  Kristi Mccoy , a 55 y.o. female  was evaluated in triage.  Pt complains of multiple complaints.  Patient is here primarily for her blood pressure.  She states that her blood pressure has been gradually rising.  Patient thinks that some of this may be situational but is also starting to experience some headache, right-sided chest/chest wall pain.  No cardiac history.  No history of hypertension.  Patient states that she has lot of anxiety, states that this.  After having surgery on her left knee has been rough.  She states that she does not feel like she is being treated well by friends, family, home health workers.  Patient has noticed that her blood pressure has been trending upward to the systolic number initially in the 150s, then 160s, and then 180s and now is 200/1 100s.  Patient has some right-sided chest/chest wall pain.  Headache without visual changes.  Headache is been ongoing for some time..  Review of Systems  Positive: Hypertension, stress/anxiety, right-sided chest wall/chest pain, headache Negative: URI symptoms, shortness of breath, abdominal complaints  Physical Exam  BP (!) 190/106 (BP Location: Left Arm)   Pulse 71   Temp 98.3 F (36.8 C) (Oral)   Resp 18   Ht 5\' 2"  (1.575 m)   Wt 90.3 kg   SpO2 96%   BMI 36.40 kg/m  Gen:   Awake, no distress   Resp:  Normal effort  MSK:   Moves extremities without difficulty  Other:  No murmurs, rubs, gallops on auscultation.  Medical Decision Making  Medically screening exam initiated at 1:04 PM.  Appropriate orders placed.  Kristi Mccoy was informed that the remainder of the evaluation will be completed by another provider, this initial triage assessment does not replace that evaluation, and the importance of remaining in the ED until their evaluation is complete.  Patient presents with hypertension, headache, chest/chest wall pain.  Patient is having progressive hypertension.  States  that she had knee surgery earlier this year, this is been progressing well and has no complaints of same.  She states that being at home she has had increased anxiety and stress from family, living situation, home health.  Patient is endorsing a large amount of anxiety.  Overall exam was reassuring.  Patient will have labs, EKG, chest x-ray at this time.   Kristi Mccoy 12/31/20 1305    01/02/21, MD 12/31/20 1315

## 2021-02-04 ENCOUNTER — Other Ambulatory Visit: Payer: Self-pay

## 2021-02-04 ENCOUNTER — Emergency Department
Admission: EM | Admit: 2021-02-04 | Discharge: 2021-02-04 | Disposition: A | Discharge status: Home / Self Care | Payer: BC Managed Care – PPO | Attending: Emergency Medicine | Admitting: Emergency Medicine

## 2021-02-04 ENCOUNTER — Emergency Department: Payer: BC Managed Care – PPO

## 2021-02-04 DIAGNOSIS — Z79899 Other long term (current) drug therapy: Secondary | ICD-10-CM | POA: Diagnosis not present

## 2021-02-04 DIAGNOSIS — Z87891 Personal history of nicotine dependence: Secondary | ICD-10-CM | POA: Insufficient documentation

## 2021-02-04 DIAGNOSIS — R519 Headache, unspecified: Secondary | ICD-10-CM | POA: Diagnosis not present

## 2021-02-04 DIAGNOSIS — I10 Essential (primary) hypertension: Secondary | ICD-10-CM | POA: Insufficient documentation

## 2021-02-04 DIAGNOSIS — Z96652 Presence of left artificial knee joint: Secondary | ICD-10-CM | POA: Insufficient documentation

## 2021-02-04 DIAGNOSIS — E119 Type 2 diabetes mellitus without complications: Secondary | ICD-10-CM | POA: Insufficient documentation

## 2021-02-04 DIAGNOSIS — R0602 Shortness of breath: Secondary | ICD-10-CM | POA: Insufficient documentation

## 2021-02-04 LAB — COMPREHENSIVE METABOLIC PANEL
ALT: 13 U/L (ref 0–44)
AST: 15 U/L (ref 15–41)
Albumin: 4.3 g/dL (ref 3.5–5.0)
Alkaline Phosphatase: 92 U/L (ref 38–126)
Anion gap: 7 (ref 5–15)
BUN: 12 mg/dL (ref 6–20)
CO2: 25 mmol/L (ref 22–32)
Calcium: 9.4 mg/dL (ref 8.9–10.3)
Chloride: 105 mmol/L (ref 98–111)
Creatinine, Ser: 0.55 mg/dL (ref 0.44–1.00)
GFR, Estimated: >60 mL/min (ref >60)
Glucose, Bld: 121 mg/dL — ABNORMAL HIGH (ref 70–99)
Potassium: 3.3 mmol/L — ABNORMAL LOW (ref 3.5–5.1)
Sodium: 137 mmol/L (ref 135–145)
Total Bilirubin: 0.9 mg/dL (ref 0.3–1.2)
Total Protein: 7.6 g/dL (ref 6.5–8.1)

## 2021-02-04 LAB — CBC
HCT: 42.1 % (ref 36.0–46.0)
Hemoglobin: 13.6 g/dL (ref 12.0–15.0)
MCH: 26.6 pg (ref 26.0–34.0)
MCHC: 32.3 g/dL (ref 30.0–36.0)
MCV: 82.2 fL (ref 80.0–100.0)
Platelets: 314 10*3/uL (ref 150–400)
RBC: 5.12 MIL/uL — ABNORMAL HIGH (ref 3.87–5.11)
RDW: 14.6 % (ref 11.5–15.5)
WBC: 6.5 10*3/uL (ref 4.0–10.5)
nRBC: 0 % (ref 0.0–0.2)

## 2021-02-04 LAB — PROTIME-INR
INR: 0.9 (ref 0.8–1.2)
Prothrombin Time: 12.6 seconds (ref 11.4–15.2)

## 2021-02-04 LAB — DIFFERENTIAL
Abs Immature Granulocytes: 0.02 10*3/uL (ref 0.00–0.07)
Basophils Absolute: 0 10*3/uL (ref 0.0–0.1)
Basophils Relative: 0 %
Eosinophils Absolute: 0 10*3/uL (ref 0.0–0.5)
Eosinophils Relative: 1 %
Immature Granulocytes: 0 %
Lymphocytes Relative: 31 %
Lymphs Abs: 2 10*3/uL (ref 0.7–4.0)
Monocytes Absolute: 0.3 10*3/uL (ref 0.1–1.0)
Monocytes Relative: 4 %
Neutro Abs: 4.2 10*3/uL (ref 1.7–7.7)
Neutrophils Relative %: 64 %

## 2021-02-04 LAB — APTT: aPTT: 31 seconds (ref 24–36)

## 2021-02-04 MED ORDER — SODIUM CHLORIDE 0.9% FLUSH
3.0000 mL | Freq: Once | INTRAVENOUS | Status: DC
Start: 2021-02-04 — End: 2021-02-04

## 2021-02-04 NOTE — Discharge Instructions (Addendum)
Your blood pressure was elevated today however your blood work was reassuring and your CAT scan did not show any acute abnormality.  Please continue to check your blood pressure daily and record this so that your doctor can have a trend of your home readings.  You may need to have an additional medication added on top of your lisinopril.

## 2021-02-04 NOTE — ED Provider Notes (Signed)
[facility]  ____________________________________________   Event Date/Time   First MD Initiated Contact with Patient 02/04/21 1226     (approximate)  I have reviewed the triage vital signs and the nursing notes.   HISTORY  Chief Complaint Hypertension    HP Chamari Statum is a 55 y.o. female with pmh DM, HTN, GERD who presents for hypertension.  Had a recent knee replacement several months ago.  Several times now her blood pressure has been elevated at physical therapy she has been told that they were not able to work with her because of this.  Today she had a follow-up with her primary care provider to have a blood pressure check and basic labs drawn.  She checked her blood pressure at home and it was in the 123456 systolic, she called her doctor and asked whether she should go to the emergency department or be seen in the office today told her to go to the ED.  Patient has a mild headache but denies visual changes numbness weakness.  She denies chest pain shortness of breath abdominal pain nausea or vomiting.  She takes 40 mg of lisinopril daily.  Blood pressures have been consistently high at home in the 200s.         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 Authorizing Provider  amLODipine (NORVASC) 5 MG tablet Take 1 tablet (5 mg total) by mouth daily. 12/31/20 12/31/21  Blake Divine, MD  clonazePAM (KLONOPIN) 1 MG tablet Take 1-2 mg by mouth 3 (three) times daily as needed for anxiety.  08/31/06   [provider]  Empagliflozin-Linaglip-Metform (TRIJARDY XR) 5-2.07-998 MG TB24 Take 1 tablet by mouth daily with breakfast.    [provider]  enoxaparin (LOVENOX) 40 MG/0.4ML injection Inject 0.4 mLs (40 mg total) into the skin daily for 14 days. 11/28/20 12/12/20  Darr, Edison Nasuti, PA-C  omeprazole (PRILOSEC) 20 MG capsule Take 20 mg by mouth daily with supper. 10/15/20   [provider]  ondansetron (ZOFRAN ODT) 4 MG disintegrating tablet Take 1 tablet (4 mg total) by mouth every 8 (eight) hours as needed for nausea or vomiting. 11/07/20   Apolonio Schneiders, FNP  oxyCODONE (ROXICODONE) 5 MG immediate release tablet Take 1 tablet (5 mg total) by mouth every 4 (four) hours as needed for severe pain. 11/28/20   Darr, Edison Nasuti, PA-C  senna-docusate (SENOKOT-S) 8.6-50 MG tablet Take 1 tablet by mouth at bedtime as needed for mild constipation. 11/29/20   Darr, Edison Nasuti, PA-C  sertraline (ZOLOFT)  100 MG tablet Take 100 mg by mouth daily. 01/21/07   [provider]  zolpidem (AMBIEN) 10 MG tablet Take 5-10 mg by mouth at bedtime. 05/01/11   [provider]    Allergies Azithromycin  Family History  Problem Relation Age of Onset   Diabetes Mother    Diabetes Father    Diabetes Sister     Social History Social History   Tobacco Use   Smoking status: Former    Types: Cigarettes   Smokeless tobacco: Never  Vaping Use   Vaping Use: Never used  Substance Use Topics   Alcohol use: No   Drug use: Not Currently    Comment: in her 20's    Review of Systems   Review of Systems  Constitutional:   Negative for chills and fever.  Eyes:  Negative for visual disturbance.  Respiratory:  Negative for shortness of breath.   Cardiovascular:  Negative for chest pain.  Gastrointestinal:  Negative for abdominal pain, nausea and vomiting.  Neurological:  Positive for headaches. Negative for seizures and weakness.  All other systems reviewed and are negative.  Physical Exam Updated Vital Signs BP (!) 185/110   Pulse 76   Temp 98.6 F (37 C) (Oral)   Resp 20   SpO2 94%   Physical Exam Vitals and nursing note reviewed.  Constitutional:      General: She is not in acute distress.    Appearance: Normal appearance.  HENT:     Head: Normocephalic and atraumatic.  Eyes:     General: No scleral icterus.    Conjunctiva/sclera: Conjunctivae normal.  Pulmonary:     Effort: Pulmonary effort is normal. No respiratory distress.     Breath sounds: No stridor.  Musculoskeletal:        General: No deformity or signs of injury.     Cervical back: Normal range of motion.  Skin:    General: Skin is dry.     Coloration: Skin is not jaundiced or pale.  Neurological:     General: No focal deficit present.     Mental Status: She is alert and oriented to person, place, and time. Mental status is at baseline.  Psychiatric:        Mood and Affect: Mood normal.        Behavior: Behavior normal.     LABS (all labs ordered are listed, but only abnormal results are displayed)  Labs Reviewed  CBC - Abnormal; Notable for the following components:      Result Value   RBC 5.12 (*)    All other components within normal limits  COMPREHENSIVE METABOLIC PANEL - Abnormal; Notable for the following components:   Potassium 3.3 (*)    Glucose, Bld 121 (*)    All other components within normal limits  PROTIME-INR  APTT  DIFFERENTIAL  I-STAT CREATININE, ED  CBG MONITORING, ED  POC URINE PREG, ED   ____________________________________________  EKG  NSR, nml axis, nml intervals, TWI in lead II, III,  AVF, V5, V6  ____________________________________________  RADIOLOGY I, Randol Kern, personally viewed and evaluated these images (plain radiographs) as part of my medical decision making, as well as reviewing the written report by the radiologist.  ED MD interpretation:  I reviewed the CT scan of the brain which does not show any acute intracranial process      ____________________________________________   PROCEDURES  Procedure(s) performed (including Critical Care):  Procedures   ____________________________________________   INITIAL IMPRESSION / ASSESSMENT  AND PLAN / ED COURSE  55 year old female who essentially presents with asymptomatic hypertension.  Was due for routine blood pressure check with her doctor because her blood pressures have been significantly elevated at physical therapy and at home but was told to come to the ED instead because they were elevated in the 200s at home.  She is hypertensive in the 180s over 110s, but appears well.  Her only symptom is a mild headache without other neurologic symptoms.  CT head was obtained which is negative.  Basic labs done from triage are also reassuring, normal creatinine.  Her EKG does show some T wave inversions in the inferior and lateral precordial leads however she has no chest pain or other anginal symptoms.  I advised the patient to continue taking her lisinopril and check blood pressure once daily and to follow-up with her primary care provider.  We had a long discussion about long-term control of her blood pressure as opposed to acutely lowering this in the ED and she understood.    ____________________________________________   FINAL CLINICAL IMPRESSION(S) / ED DIAGNOSES  Final diagnoses:  Hypertension, unspecified type     ED Discharge Orders     None        Note:  This document was prepared using Dragon voice recognition software and may include unintentional dictation errors.    Rada Hay, MD 02/04/21 [phone]

## 2021-02-04 NOTE — ED Triage Notes (Signed)
Pt comes with c/o HTN, SOB and left knee pain following knee surgery. Pt states her BP has been elevated. Pt takes BP meds regularly.  Pt states some weakness.

## 2021-02-04 NOTE — ED Notes (Signed)
Pt reports HTN for weeks, called PCP today due to HTN and was told to come to ER. States nobody has cared about her HTN until she recently had knee surgery and PT has been refusing to work with her d/t HTN Pt in NAD Ambulatory  Meds recently changed

## 2021-11-15 ENCOUNTER — Other Ambulatory Visit: Payer: Self-pay

## 2021-11-15 ENCOUNTER — Emergency Department: Payer: BC Managed Care – PPO

## 2021-11-15 ENCOUNTER — Emergency Department
Admission: EM | Admit: 2021-11-15 | Discharge: 2021-11-15 | Disposition: A | Discharge status: Home / Self Care | Payer: BC Managed Care – PPO | Attending: Emergency Medicine | Admitting: Emergency Medicine

## 2021-11-15 ENCOUNTER — Encounter: Payer: Self-pay | Admitting: Intensive Care

## 2021-11-15 DIAGNOSIS — S29011A Strain of muscle and tendon of front wall of thorax, initial encounter: Secondary | ICD-10-CM | POA: Diagnosis not present

## 2021-11-15 DIAGNOSIS — X503XXA Overexertion from repetitive movements, initial encounter: Secondary | ICD-10-CM | POA: Diagnosis not present

## 2021-11-15 DIAGNOSIS — R0789 Other chest pain: Secondary | ICD-10-CM

## 2021-11-15 DIAGNOSIS — E119 Type 2 diabetes mellitus without complications: Secondary | ICD-10-CM | POA: Insufficient documentation

## 2021-11-15 DIAGNOSIS — I10 Essential (primary) hypertension: Secondary | ICD-10-CM | POA: Diagnosis not present

## 2021-11-15 DIAGNOSIS — S299XXA Unspecified injury of thorax, initial encounter: Secondary | ICD-10-CM | POA: Diagnosis present

## 2021-11-15 DIAGNOSIS — T148XXA Other injury of unspecified body region, initial encounter: Secondary | ICD-10-CM

## 2021-11-15 HISTORY — DX: Essential (primary) hypertension: I10

## 2021-11-15 LAB — CBC
HCT: 42.5 % (ref 36.0–46.0)
Hemoglobin: 13.3 g/dL (ref 12.0–15.0)
MCH: 25.3 pg — ABNORMAL LOW (ref 26.0–34.0)
MCHC: 31.3 g/dL (ref 30.0–36.0)
MCV: 81 fL (ref 80.0–100.0)
Platelets: 278 10*3/uL (ref 150–400)
RBC: 5.25 MIL/uL — ABNORMAL HIGH (ref 3.87–5.11)
RDW: 15.2 % (ref 11.5–15.5)
WBC: 8.4 10*3/uL (ref 4.0–10.5)
nRBC: 0 % (ref 0.0–0.2)

## 2021-11-15 LAB — BASIC METABOLIC PANEL
Anion gap: 8 (ref 5–15)
BUN: 14 mg/dL (ref 6–20)
CO2: 25 mmol/L (ref 22–32)
Calcium: 9.4 mg/dL (ref 8.9–10.3)
Chloride: 107 mmol/L (ref 98–111)
Creatinine, Ser: 0.57 mg/dL (ref 0.44–1.00)
GFR, Estimated: >60 mL/min (ref >60)
Glucose, Bld: 165 mg/dL — ABNORMAL HIGH (ref 70–99)
Potassium: 3.7 mmol/L (ref 3.5–5.1)
Sodium: 140 mmol/L (ref 135–145)

## 2021-11-15 LAB — TROPONIN I (HIGH SENSITIVITY): Troponin I (High Sensitivity): 3 ng/L (ref <18)

## 2021-11-15 MED ORDER — LIDOCAINE 5 % EX PTCH: 1.0000 | MEDICATED_PATCH | Freq: Two times a day (BID) | CUTANEOUS | 0 refills | Status: AC

## 2021-11-15 MED ORDER — LIDOCAINE 5 % EX PTCH
1.0000 | MEDICATED_PATCH | CUTANEOUS | Status: DC
  Administered 2021-11-15: 1 via TRANSDERMAL
  Filled 2021-11-15: qty 1

## 2021-11-15 NOTE — ED Provider Notes (Signed)
[facility] Provider Note    Event Date/Time   First MD Initiated Contact with Patient 11/15/21 1359     (approximate)   History   Chief Complaint Chest Pain   HPI  Kristi Mccoy is a 56 y.o. female with past medical history of hypertension, diabetes, GERD, and depression who presents to the ED complaining of chest pain.  Patient reports that she has been dealing with about 4 days of constant pain over the left side of her chest.  She describes the pain as dull and achy, worse when she goes to move.  She has not noticed any fevers, cough, shortness of breath, pain or swelling in her legs.  She does state that the left side of her chest seems sore to touch.  She reports helping her daughter move and lifting a couple of mattresses 2 days prior to onset of pain.  She denies any significant cardiac history.     Physical Exam   Triage Vital Signs: ED Triage Vitals  Enc Vitals Group     BP 11/15/21 1218 (!) 155/92     Pulse Rate 11/15/21 1218 71     Resp 11/15/21 1218 20     Temp 11/15/21 1218 98.6 F (37 C)     Temp Source 11/15/21 1218 Oral     SpO2 11/15/21 1218 93 %     Weight 11/15/21 1213 230 lb (104.3 kg)     Height 11/15/21 1213 5' 1.5" (1.562 m)     Head Circumference --      Peak Flow --      Pain Score 11/15/21 1212 7     Pain Loc --      Pain Edu? --      Excl. in GC? --     Most recent vital signs: Vitals:   11/15/21 1218  BP: (!) 155/92  Pulse: 71  Resp: 20  Temp: 98.6 F (37 C)  SpO2: 93%    Constitutional: Alert and oriented. Eyes: Conjunctivae are normal. Head: Atraumatic. Nose: No congestion/rhinnorhea. Mouth/Throat: Mucous membranes are moist.  Cardiovascular: Normal rate, regular rhythm. Grossly normal heart sounds.  2+ radial pulses bilaterally. Respiratory: Normal respiratory effort.  No retractions. Lungs CTAB.  Left chest wall tenderness to palpation noted. Gastrointestinal: Soft and nontender. No  distention. Musculoskeletal: No lower extremity tenderness nor edema.  Neurologic:  Normal speech and language. No gross focal neurologic deficits are appreciated.    ED Results / Procedures / Treatments   Labs (all labs ordered are listed, but only abnormal results are displayed) Labs Reviewed  BASIC METABOLIC PANEL - Abnormal; Notable for the following components:      Result Value   Glucose, Bld 165 (*)    All other components within normal limits  CBC - Abnormal; Notable for the following components:   RBC 5.25 (*)    MCH 25.3 (*)    All other components within normal limits  TROPONIN I (HIGH SENSITIVITY)     EKG  ED ECG REPORT I, Chesley Noon, the attending physician, personally viewed and interpreted this ECG.   Date: 11/15/2021  EKG Time: 12:23  Rate: 68  Rhythm: normal sinus rhythm  Axis: Normal  Intervals:none  ST&T Change: None  RADIOLOGY Chest x-ray reviewed and interpreted by me with no infiltrate, edema, or effusion.  PROCEDURES:  Critical Care performed: No  Procedures   MEDICATIONS ORDERED IN ED: Medications  lidocaine (LIDODERM) 5 % 1 patch (1 patch Transdermal Patch Applied  11/15/21 1446)     IMPRESSION / MDM / ASSESSMENT AND PLAN / ED COURSE  I reviewed the triage vital signs and the nursing notes.                              56 y.o. female with past medical history of hypertension, diabetes, GERD, and depression who presents to the ED complaining of 4 days of constant left-sided chest pain, where she is sore to touch.  Patient's presentation is most consistent with acute presentation with potential threat to life or bodily function.  Differential diagnosis includes, but is not limited to, ACS, PE, pneumonia, pneumothorax, dissection, musculoskeletal pain, GERD, and anxiety.  Patient nontoxic-appearing and in no acute distress, vital signs are unremarkable.  EKG shows no evidence of arrhythmia or ischemia and initial troponin is  negative.  Low suspicion for ACS given her atypical symptoms with pain reproducible upon palpation.  Additional labs are reassuring with no significant anemia, leukocytosis, electrolyte abnormality, or AKI.  Chest x-ray is also unremarkable.  Given pain is reproducible, suspect musculoskeletal etiology, especially given her recent heavy lifting.  We will treat symptomatically with Lidoderm patches and patient is appropriate for discharge home with PCP follow-up.  She was counseled to return to the ED for new or worsening symptoms, patient agrees with plan.      FINAL CLINICAL IMPRESSION(S) / ED DIAGNOSES   Final diagnoses:  Atypical chest pain  Muscle strain     Rx / DC Orders   ED Discharge Orders          Ordered    lidocaine (LIDODERM) 5 %  Every 12 hours        11/15/21 1448             Note:  This document was prepared using Dragon voice recognition software and may include unintentional dictation errors.   Chesley Noon, MD 11/15/21 1455

## 2021-11-15 NOTE — ED Triage Notes (Signed)
Patient c/o left sided chest pain since Monday that has progressively gotten worse. Radiating down to left arm and shoulder blade.

## 2021-11-15 NOTE — ED Notes (Signed)
See triage note  Presents with left sided chest pain  States pain started while at rest on Monday  States pain is increased with movement  and subsides while at rest  Pain is moving into left arm and posterior shoulder area

## 2022-12-30 ENCOUNTER — Other Ambulatory Visit: Payer: Self-pay

## 2022-12-30 ENCOUNTER — Emergency Department
Admission: EM | Admit: 2022-12-30 | Discharge: 2022-12-30 | Disposition: A | Discharge status: Home / Self Care | Payer: BC Managed Care – PPO | Attending: Emergency Medicine | Admitting: Emergency Medicine

## 2022-12-30 ENCOUNTER — Emergency Department: Payer: BC Managed Care – PPO

## 2022-12-30 DIAGNOSIS — S0990XA Unspecified injury of head, initial encounter: Secondary | ICD-10-CM

## 2022-12-30 DIAGNOSIS — W01198A Fall on same level from slipping, tripping and stumbling with subsequent striking against other object, initial encounter: Secondary | ICD-10-CM | POA: Insufficient documentation

## 2022-12-30 DIAGNOSIS — I10 Essential (primary) hypertension: Secondary | ICD-10-CM | POA: Diagnosis not present

## 2022-12-30 DIAGNOSIS — S92355A Nondisplaced fracture of fifth metatarsal bone, left foot, initial encounter for closed fracture: Secondary | ICD-10-CM

## 2022-12-30 DIAGNOSIS — S99922A Unspecified injury of left foot, initial encounter: Secondary | ICD-10-CM | POA: Diagnosis present

## 2022-12-30 DIAGNOSIS — W19XXXA Unspecified fall, initial encounter: Secondary | ICD-10-CM

## 2022-12-30 DIAGNOSIS — S99911A Unspecified injury of right ankle, initial encounter: Secondary | ICD-10-CM

## 2022-12-30 DIAGNOSIS — E119 Type 2 diabetes mellitus without complications: Secondary | ICD-10-CM | POA: Insufficient documentation

## 2022-12-30 MED ORDER — ACETAMINOPHEN 500 MG PO TABS
1000.0000 mg | ORAL_TABLET | Freq: Once | ORAL | Status: AC
  Administered 2022-12-30: 1000 mg via ORAL
  Filled 2022-12-30: qty 2

## 2022-12-30 MED ORDER — KETOROLAC TROMETHAMINE 30 MG/ML IJ SOLN
30.0000 mg | Freq: Once | INTRAMUSCULAR | Status: AC
  Administered 2022-12-30: 30 mg via INTRAMUSCULAR
  Filled 2022-12-30: qty 1

## 2022-12-30 NOTE — ED Provider Triage Note (Signed)
Emergency Medicine Provider Triage Evaluation Note  Kristi Mccoy , a 57 y.o. female  was evaluated in triage.  Pt complains of head injury, right lower leg and foot pain after a fall on Sunday.  Patient fell down steps.  No LOC.  Has headache.  No vomiting.  No neck pain.  No numbness or tingling..  Review of Systems  Positive:  Negative:   Physical Exam  Ht 5\' 2"  (1.575 m)   Wt 102.5 kg   BMI 41.34 kg/m  Gen:   Awake, no distress   Resp:  Normal effort  MSK:   Moves extremities without difficulty, right foot swollen and tender, right tib-fib tender Other:  Back of skull is tender to palpation, neck is supple, no lymphadenopathy and no spinal tenderness of the C-spine  Medical Decision Making  Medically screening exam initiated at 8:54 AM.  Appropriate orders placed.  Kristi Mccoy was informed that the remainder of the evaluation will be completed by another provider, this initial triage assessment does not replace that evaluation, and the importance of remaining in the ED until their evaluation is complete.     Kristi Ghee, PA-C 12/30/22 [phone]

## 2022-12-30 NOTE — Discharge Instructions (Addendum)
You have a broken bone in your foot. Use the CAM boot when walking. Call your podiatrist or Dr. Gala Lewandowsky for a follow up.   Take acetaminophen 650 mg and ibuprofen 400 mg every 6 hours for pain.  Take with food.  Thank you for choosing [place] for your health care today!  Please see your primary doctor this week for a follow up appointment.   If you have any new, worsening, or unexpected symptoms call your doctor right away or come back to the emergency department for reevaluation.  It was my pleasure to care for you today.   Daneil Dan Modesto Charon, MD

## 2022-12-30 NOTE — ED Provider Notes (Addendum)
[facility] Provider Note    Event Date/Time   First MD Initiated Contact with Patient 12/30/22 0901     (approximate)   History   Fall   HPI  Kristi Mccoy is a 57 y.o. female   Past medical history of hypertension, diabetes, depression and anxiety who presents to the emergency department with an injury sustained 2 days ago while doing laundry she fell hit her head and injured her right foot/ankle.  She sought care today at the urgent care clinic who sent her to the emergency department for further evaluation.  She denies blood thinner use, did not lose consciousness, did not vomit, no amnesia.  She reports that headache ever since her fall.  She has dizziness and blurred vision but is at baseline and unchanged since her fall.  Has been ambulatory despite the foot/ankle pain.   External Medical Documents Reviewed: Urgent care note from earlier today documenting her injury as above      Physical Exam   Triage Vital Signs: ED Triage Vitals  Encounter Vitals Group     BP 12/30/22 0854 133/86     Systolic BP Percentile --      Diastolic BP Percentile --      Pulse Rate 12/30/22 0854 77     Resp 12/30/22 0854 18     Temp 12/30/22 0854 99 F (37.2 C)     Temp src --      SpO2 12/30/22 0854 96 %     Weight 12/30/22 0853 226 lb (102.5 kg)     Height 12/30/22 0853 5\' 2"  (1.575 m)     Head Circumference --      Peak Flow --      Pain Score 12/30/22 0852 6     Pain Loc --      Pain Education --      Exclude from Growth Chart --     Most recent vital signs: Vitals:   12/30/22 0854  BP: 133/86  Pulse: 77  Resp: 18  Temp: 99 F (37.2 C)  SpO2: 96%    General: Awake, no distress.  CV:  Good peripheral perfusion.  Resp:  Normal effort.  Abd:  No distention.  Other:  Pain to palpation along the right lateral ankle, right midfoot.  No obvious signs of head trauma hematoma.  Neck supple full range of motion.  Thorax abdomen and back palpated  without any deformities step-offs or tenderness to palpation.   ED Results / Procedures / Treatments   Labs (all labs ordered are listed, but only abnormal results are displayed) Labs Reviewed - No data to display   RADIOLOGY I independently reviewed and interpreted CT scan of the head and I see no obvious bleed or midline shift I also reviewed radiologist's formal read.   PROCEDURES:  Critical Care performed: No  Procedures   MEDICATIONS ORDERED IN ED: Medications  acetaminophen (TYLENOL) tablet 1,000 mg (1,000 mg Oral Given 12/30/22 0948)  ketorolac (TORADOL) 30 MG/ML injection 30 mg (30 mg Intramuscular Given 12/30/22 0948)    IMPRESSION / MDM / ASSESSMENT AND PLAN / ED COURSE  I reviewed the triage vital signs and the nursing notes.                                Patient's presentation is most consistent with acute presentation with potential threat to life or bodily function.  Differential diagnosis includes,  but is not limited to, blunt traumatic injury including skull fracture, ICH, concussion, lower extremity fracture dislocation, compartment syndrome or neurovascular injury   The patient is on the cardiac monitor to evaluate for evidence of arrhythmia and/or significant heart rate changes.  MDM:    Mechanical slip and fall leading to blunt traumatic injury concerning for ICH or skull fracture given severe headache, get a CT of the head.  Lower extremity x-ray to rule out fracture or dislocation.  If no emergent traumatic injuries noted, plan will be for anticipatory guidance, discharge and PMD follow-up.  5th metatarsal fx; cam boot; fu podiatry.      FINAL CLINICAL IMPRESSION(S) / ED DIAGNOSES   Final diagnoses:  Fall, initial encounter  Traumatic injury of head, initial encounter  Ankle injury, right, initial encounter  Nondisplaced fracture of fifth metatarsal bone, left foot, initial encounter for closed fracture     Rx / DC Orders   ED  Discharge Orders     None        Note:  This document was prepared using Dragon voice recognition software and may include unintentional dictation errors.    Pilar Jarvis, MD 12/30/22 1036    Pilar Jarvis, MD 12/30/22 [phone]

## 2022-12-30 NOTE — ED Triage Notes (Signed)
Pt to ED from [facility] for fall 2 day ago. Slipped while carrying laundry. C/o hitting head, denies blood thinners or LOC. Also c/o right foot/ankle pain.

## 2023-09-11 IMAGING — CR DG CHEST 2V
1 series · 2 of 2 positions shown · non-contrast
Comparison: Chest x-ray dated November 19, 2020

CLINICAL DATA: Chest pain

EXAM:
CHEST - 2 VIEW

[Series 1: dg chest 2 view · 0.14mm/px · 2 of 2 slices shown]
[im 1/2]
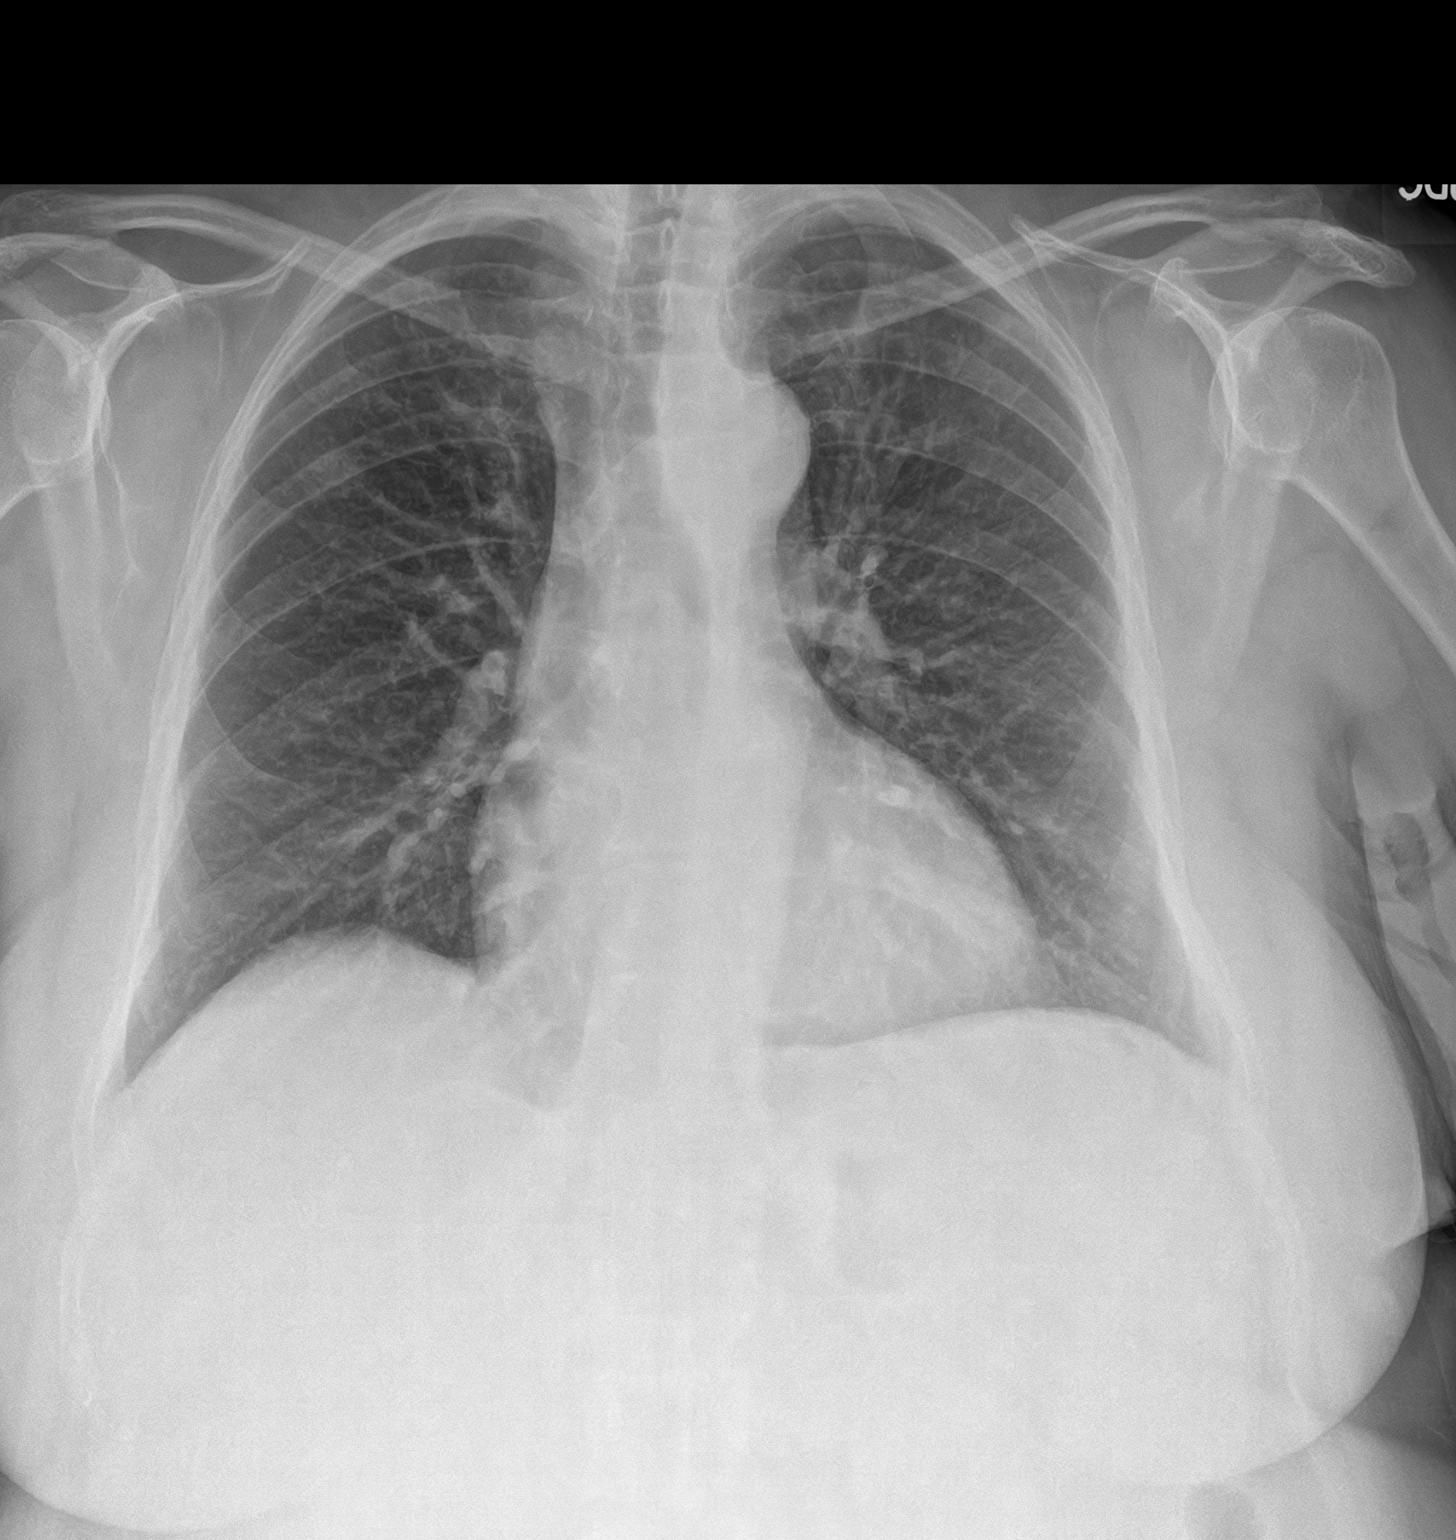
[im 2/2]
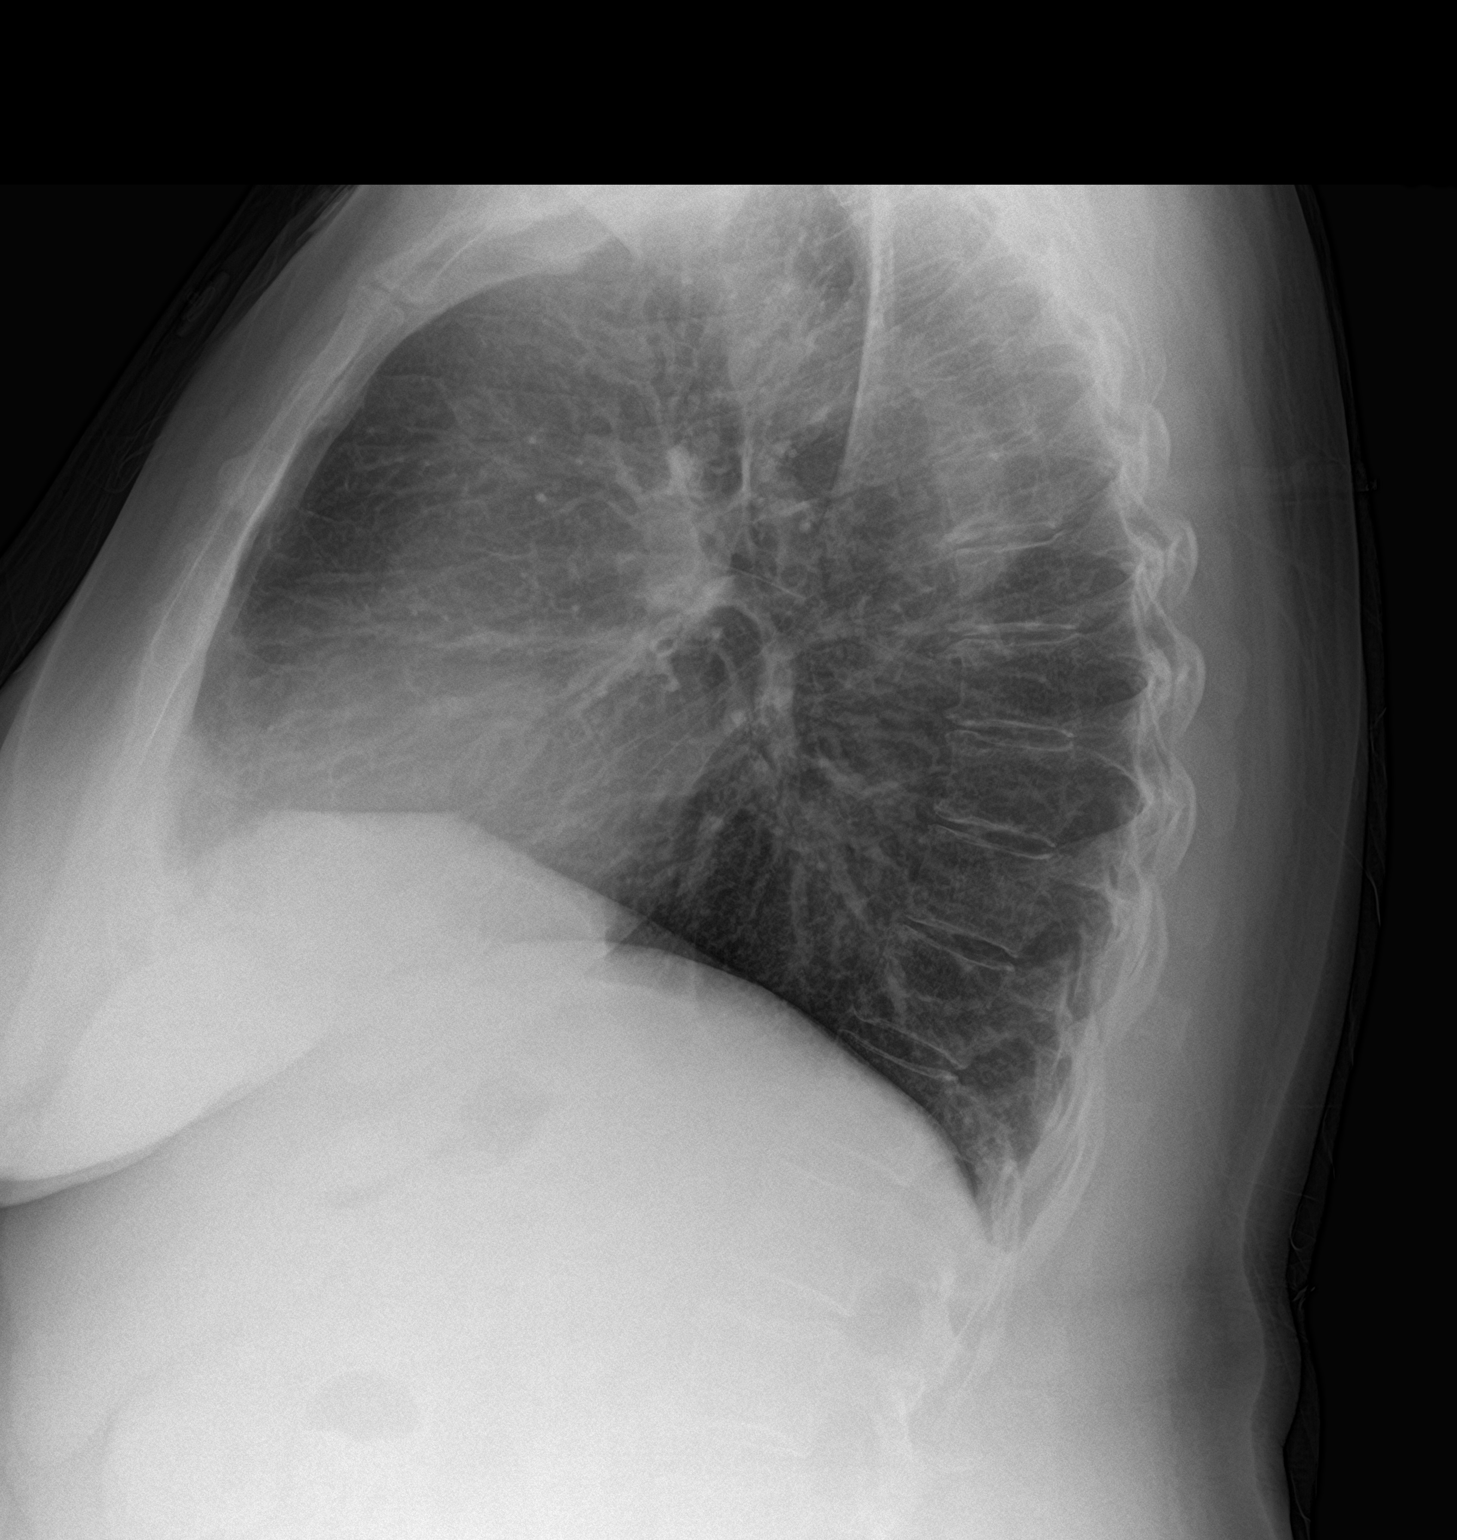

[2 of 2 positions shown; findings below may reference images not displayed]

FINDINGS: Cardiac and mediastinal contours are within normal limits. Rounded
opacity of the upper right lung is favored to represent an overlying
button or lead. Lungs are otherwise clear. No evidence of pleural
effusion or pneumothorax.
IMPRESSION: No active cardiopulmonary disease.
